# Patient Record
Sex: Female | Born: 1950 | Race: White | Hispanic: No | Marital: Married | Smoking: Former smoker
Health system: Southern US, Community
[De-identification: ages and names within clinical notes are randomized; demographics above are authoritative.]

## PROBLEM LIST (undated history)

## (undated) DIAGNOSIS — M1990 Unspecified osteoarthritis, unspecified site: Secondary | ICD-10-CM

## (undated) HISTORY — DX: Unspecified osteoarthritis, unspecified site: M19.90

## (undated) HISTORY — PX: OTHER SURGICAL HISTORY: SHX169

## (undated) HISTORY — PX: COLONOSCOPY: SHX174

## (undated) HISTORY — PX: BACK SURGERY: SHX140

## (undated) HISTORY — PX: TRIGGER FINGER RELEASE: SHX641

---

## 1997-12-29 ENCOUNTER — Other Ambulatory Visit: Admission: RE | Admit: 1997-12-29 | Discharge: 1997-12-29 | Payer: Self-pay | Admitting: *Deleted

## 1999-01-10 ENCOUNTER — Other Ambulatory Visit: Admission: RE | Admit: 1999-01-10 | Discharge: 1999-01-10 | Payer: Self-pay | Admitting: *Deleted

## 1999-05-31 ENCOUNTER — Encounter: Admission: RE | Admit: 1999-05-31 | Discharge: 1999-05-31 | Payer: Self-pay | Admitting: *Deleted

## 1999-05-31 ENCOUNTER — Encounter: Payer: Self-pay | Admitting: *Deleted

## 1999-06-01 ENCOUNTER — Encounter: Admission: RE | Admit: 1999-06-01 | Discharge: 1999-06-01 | Payer: Self-pay | Admitting: *Deleted

## 1999-06-01 ENCOUNTER — Encounter: Payer: Self-pay | Admitting: *Deleted

## 2000-01-09 ENCOUNTER — Other Ambulatory Visit: Admission: RE | Admit: 2000-01-09 | Discharge: 2000-01-09 | Payer: Self-pay | Admitting: *Deleted

## 2000-06-14 ENCOUNTER — Encounter: Payer: Self-pay | Admitting: *Deleted

## 2000-06-14 ENCOUNTER — Encounter: Admission: RE | Admit: 2000-06-14 | Discharge: 2000-06-14 | Payer: Self-pay | Admitting: *Deleted

## 2000-12-31 ENCOUNTER — Other Ambulatory Visit: Admission: RE | Admit: 2000-12-31 | Discharge: 2000-12-31 | Payer: Self-pay | Admitting: *Deleted

## 2001-06-18 ENCOUNTER — Encounter: Admission: RE | Admit: 2001-06-18 | Discharge: 2001-06-18 | Payer: Self-pay | Admitting: *Deleted

## 2001-06-18 ENCOUNTER — Encounter: Payer: Self-pay | Admitting: *Deleted

## 2002-01-01 ENCOUNTER — Other Ambulatory Visit: Admission: RE | Admit: 2002-01-01 | Discharge: 2002-01-01 | Payer: Self-pay | Admitting: *Deleted

## 2002-06-26 ENCOUNTER — Encounter: Admission: RE | Admit: 2002-06-26 | Discharge: 2002-06-26 | Payer: Self-pay | Admitting: *Deleted

## 2002-06-26 ENCOUNTER — Encounter: Payer: Self-pay | Admitting: *Deleted

## 2003-01-06 ENCOUNTER — Other Ambulatory Visit: Admission: RE | Admit: 2003-01-06 | Discharge: 2003-01-06 | Payer: Self-pay | Admitting: *Deleted

## 2003-07-01 ENCOUNTER — Encounter: Admission: RE | Admit: 2003-07-01 | Discharge: 2003-07-01 | Payer: Self-pay | Admitting: *Deleted

## 2004-01-12 ENCOUNTER — Other Ambulatory Visit: Admission: RE | Admit: 2004-01-12 | Discharge: 2004-01-12 | Payer: Self-pay | Admitting: *Deleted

## 2004-06-30 ENCOUNTER — Encounter: Admission: RE | Admit: 2004-06-30 | Discharge: 2004-06-30 | Payer: Self-pay | Admitting: *Deleted

## 2005-02-13 ENCOUNTER — Other Ambulatory Visit: Admission: RE | Admit: 2005-02-13 | Discharge: 2005-02-13 | Payer: Self-pay | Admitting: *Deleted

## 2005-07-04 ENCOUNTER — Encounter: Admission: RE | Admit: 2005-07-04 | Discharge: 2005-07-04 | Payer: Self-pay | Admitting: *Deleted

## 2006-02-22 ENCOUNTER — Other Ambulatory Visit: Admission: RE | Admit: 2006-02-22 | Discharge: 2006-02-22 | Payer: Self-pay | Admitting: *Deleted

## 2006-07-11 ENCOUNTER — Encounter: Admission: RE | Admit: 2006-07-11 | Discharge: 2006-07-11 | Payer: Self-pay | Admitting: *Deleted

## 2006-07-11 IMAGING — MG MM SCREEN MAMMOGRAM BILATERAL
4 series · 4 of 4 positions shown · non-contrast
Comparison: none

DG SCREEN MAMMOGRAM BILATERAL
Bilateral CC and MLO view(s) were taken.

DIGITAL SCREENING MAMMOGRAM WITH CAD:
There is a fibroglandular pattern.  No masses or malignant type calcifications are identified.  
Compared with prior studies.

[R CC]
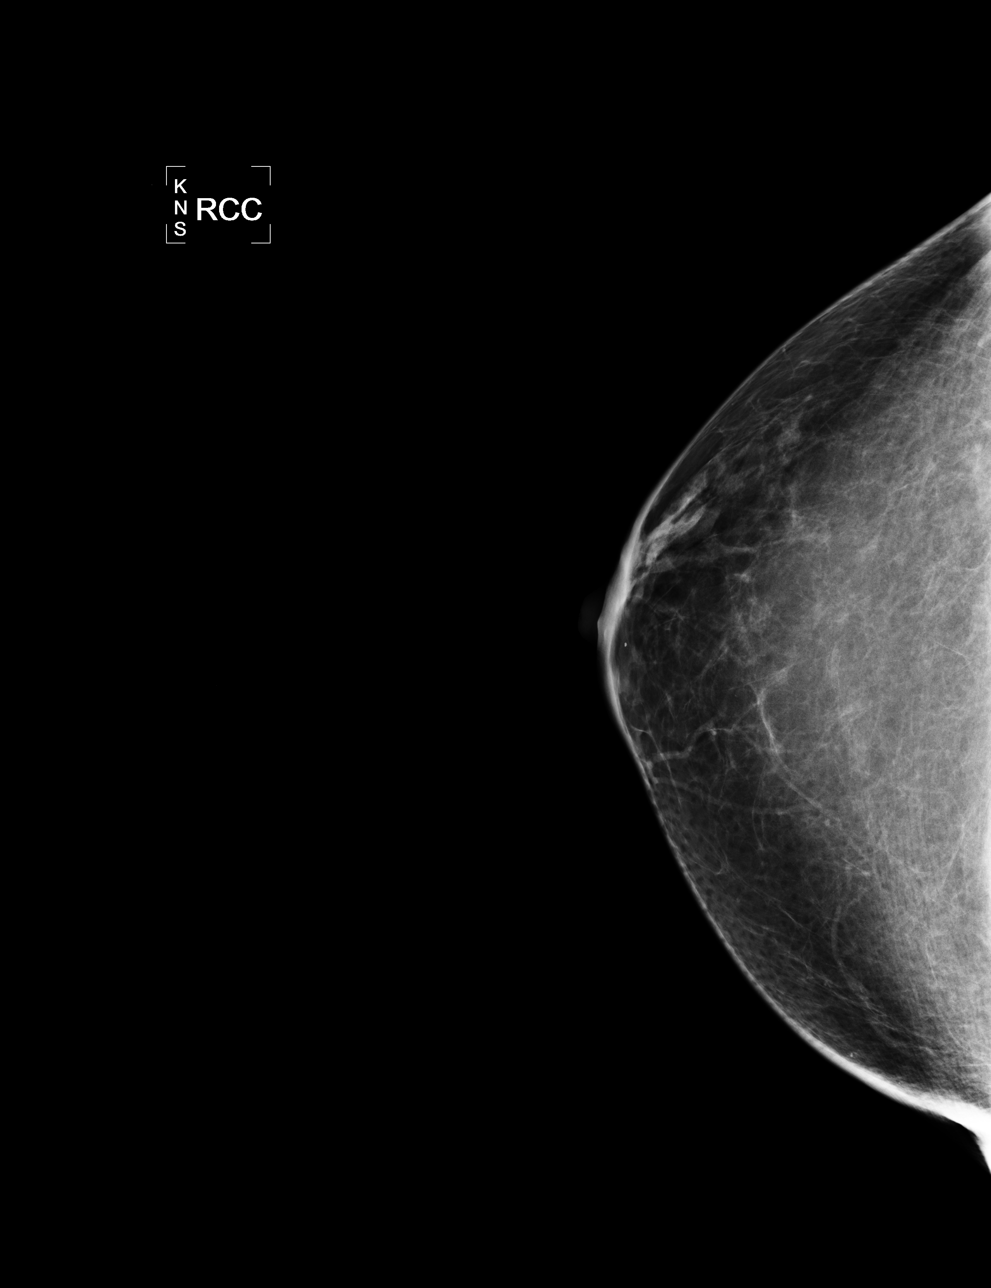

[L CC]
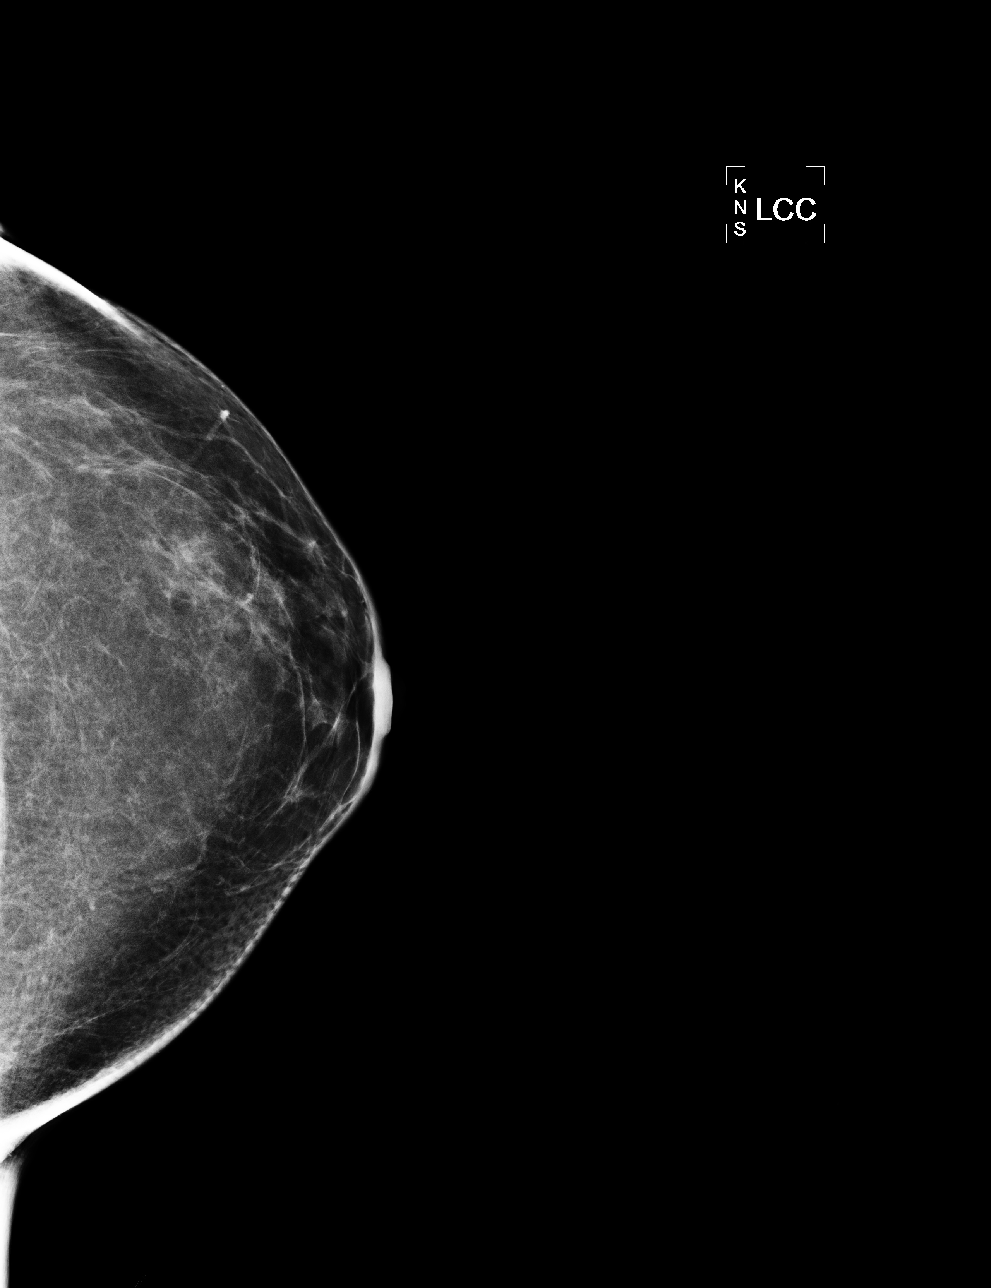

[L MLO]
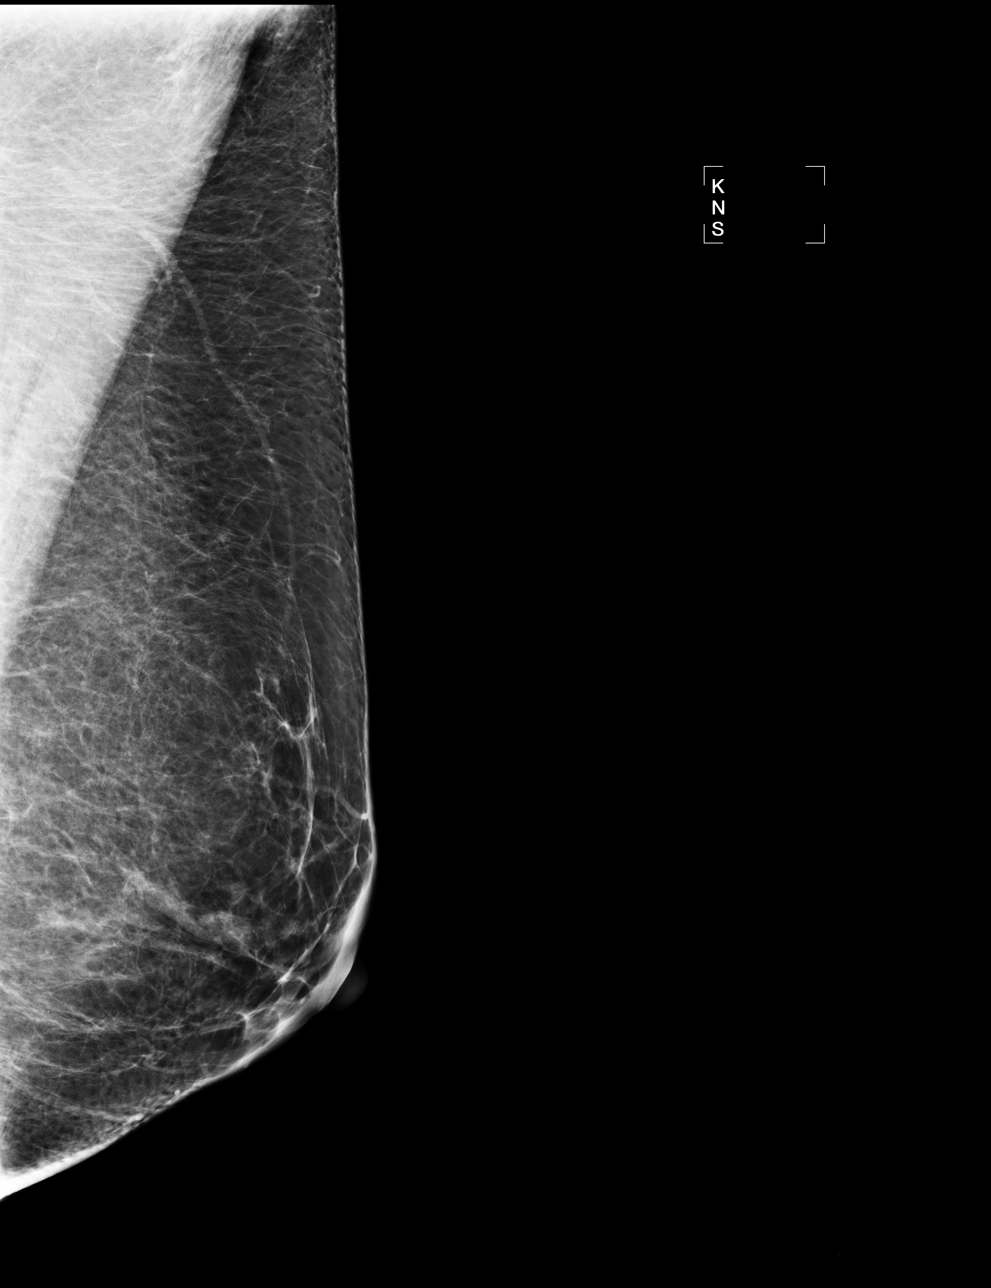

[R MLO]
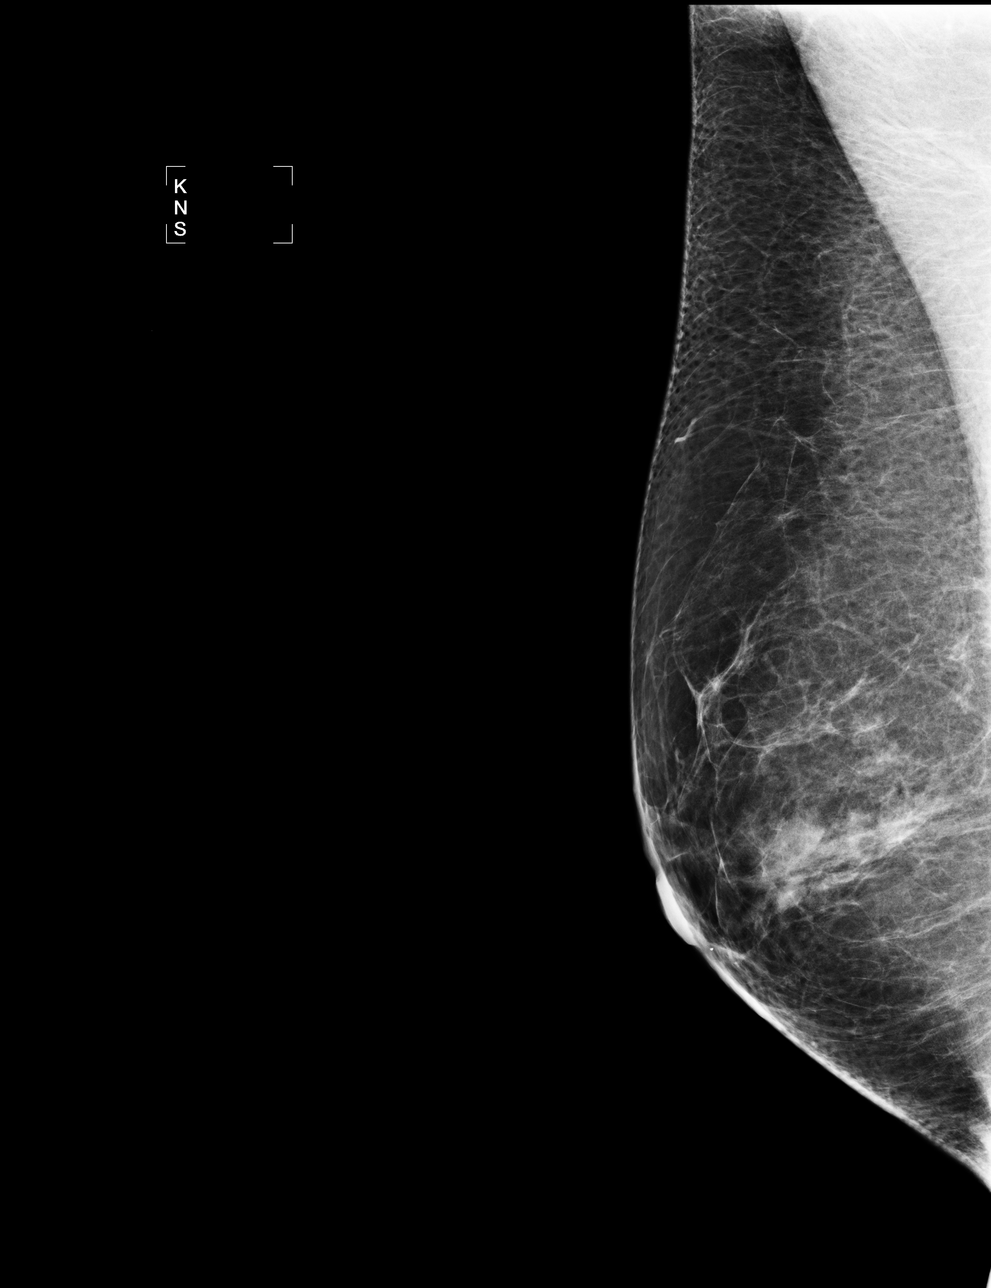

[4 of 4 positions shown; findings below may reference images not displayed]

IMPRESSION: No specific mammographic evidence of malignancy.  Next screening mammogram is recommended in one 
year.

ASSESSMENT: Negative - BI-RADS 1

Screening mammogram in 1 year.
ANALYZED BY COMPUTER AIDED DETECTION. , THIS PROCEDURE WAS A DIGITAL MAMMOGRAM.

## 2007-02-19 ENCOUNTER — Other Ambulatory Visit: Admission: RE | Admit: 2007-02-19 | Discharge: 2007-02-19 | Payer: Self-pay | Admitting: *Deleted

## 2007-07-17 ENCOUNTER — Encounter: Admission: RE | Admit: 2007-07-17 | Discharge: 2007-07-17 | Payer: Self-pay | Admitting: *Deleted

## 2007-07-17 IMAGING — MG MM SCREEN MAMMOGRAM BILATERAL
4 series · 4 of 4 positions shown · non-contrast
Comparison: Prior studies.

DG SCREEN MAMMOGRAM BILATERAL
Bilateral CC and MLO view(s) were taken.

DIGITAL SCREENING MAMMOGRAM WITH CAD:

[R CC]
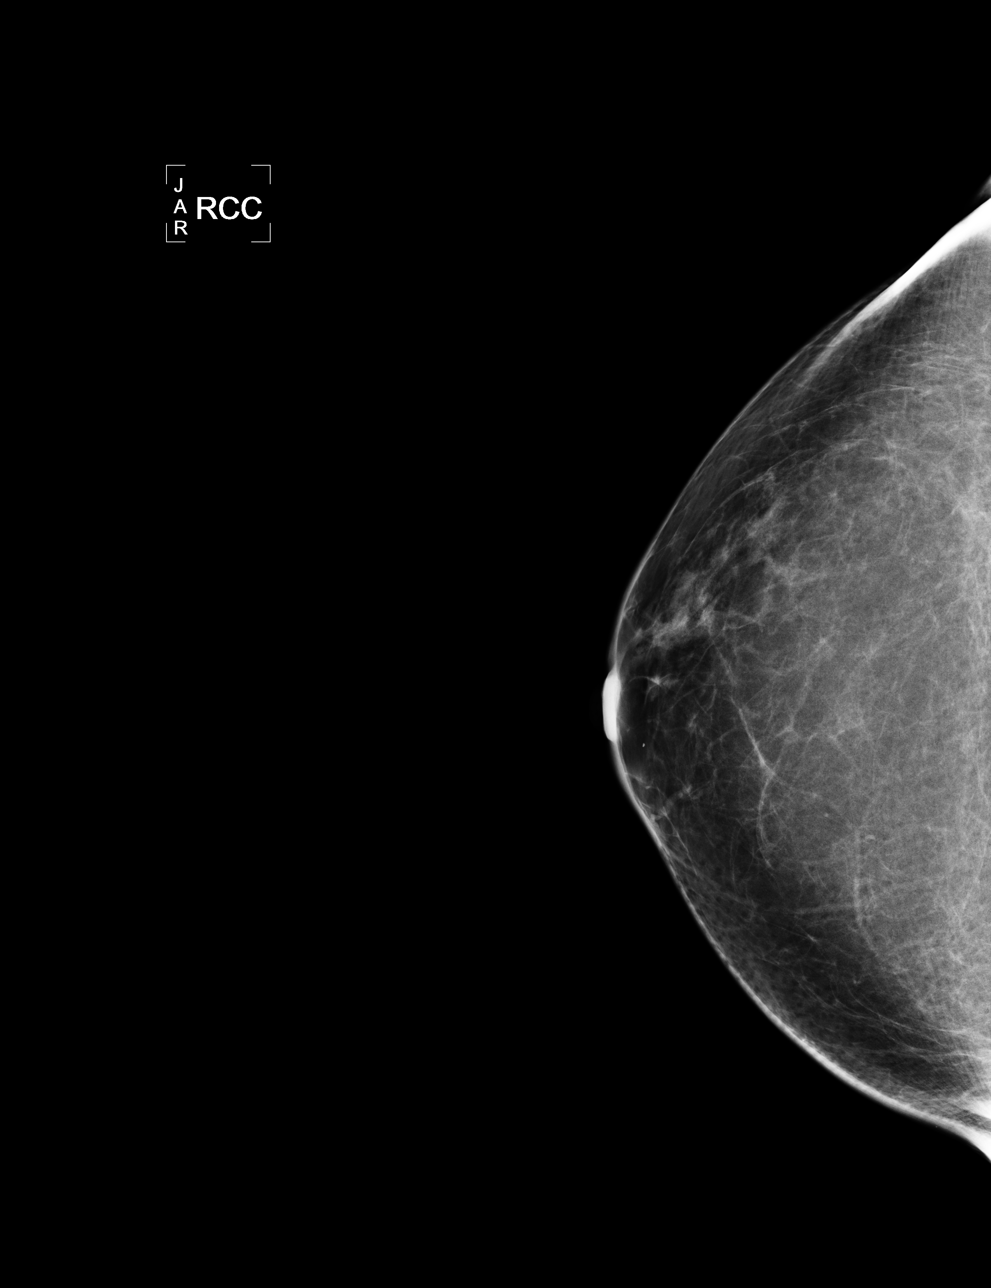

[L CC]
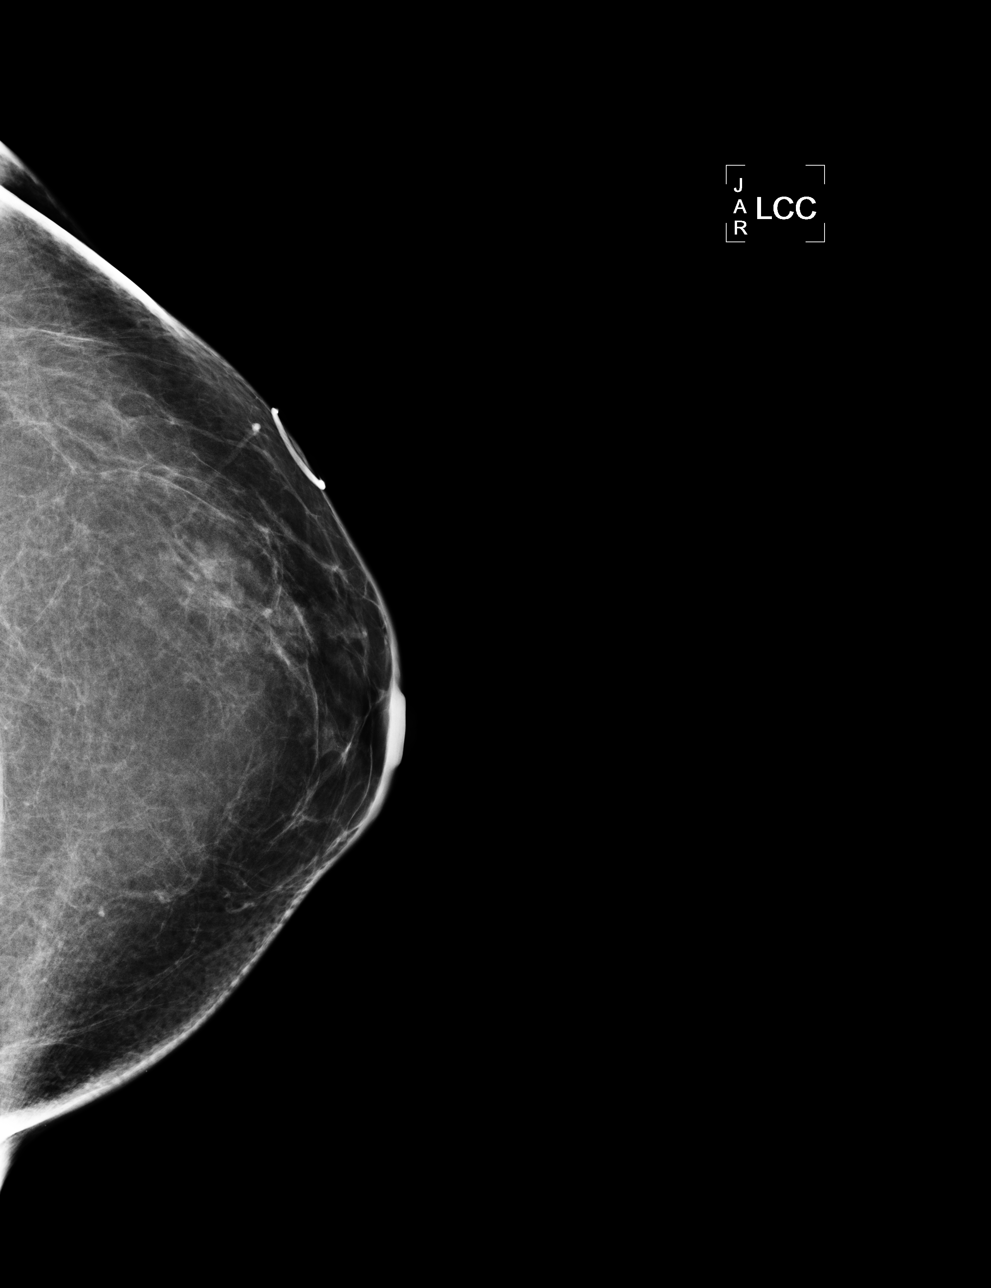

[L MLO]
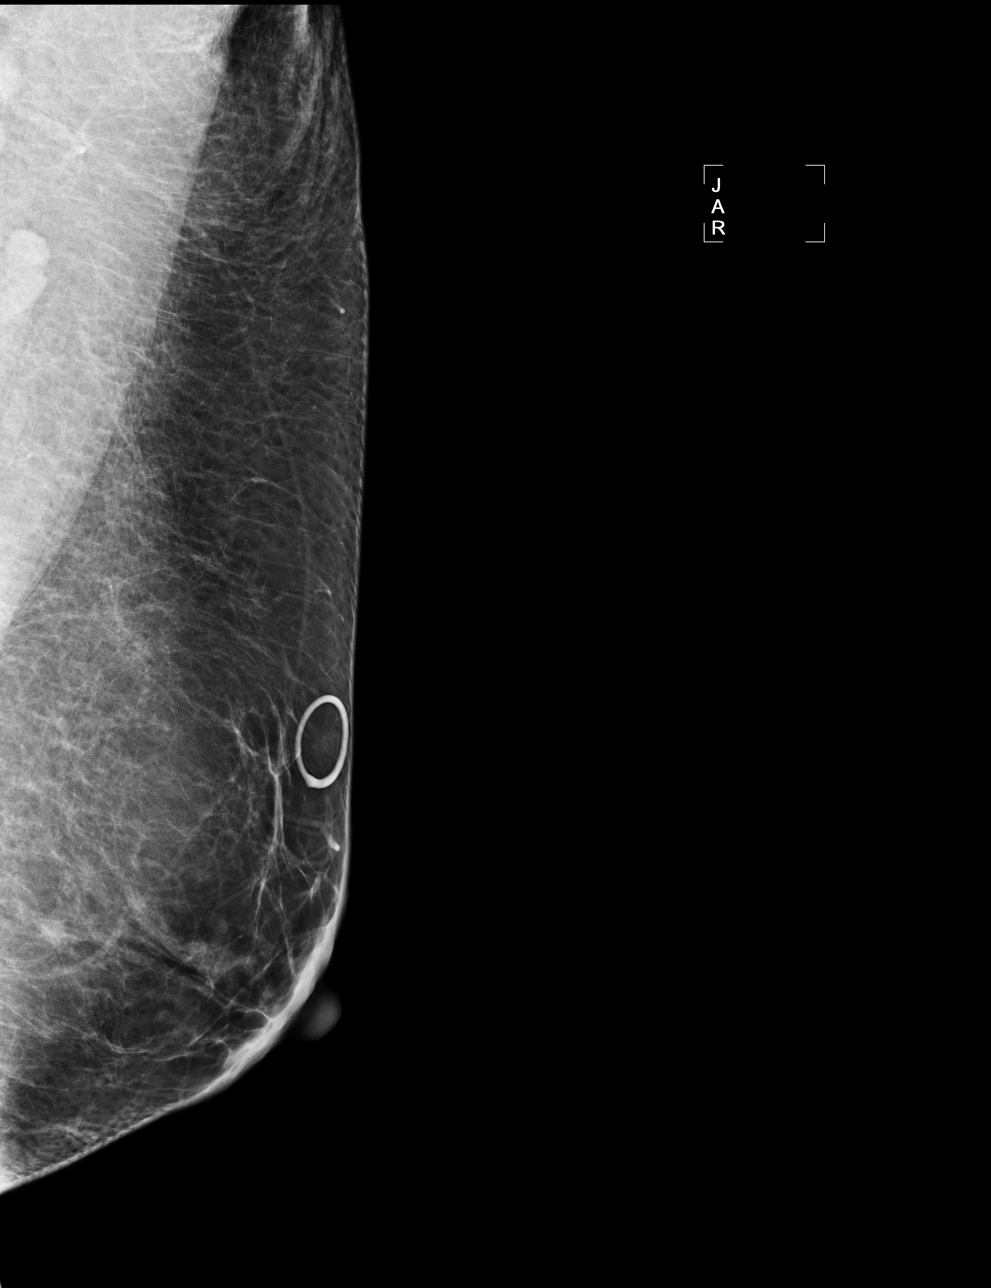

[R MLO]
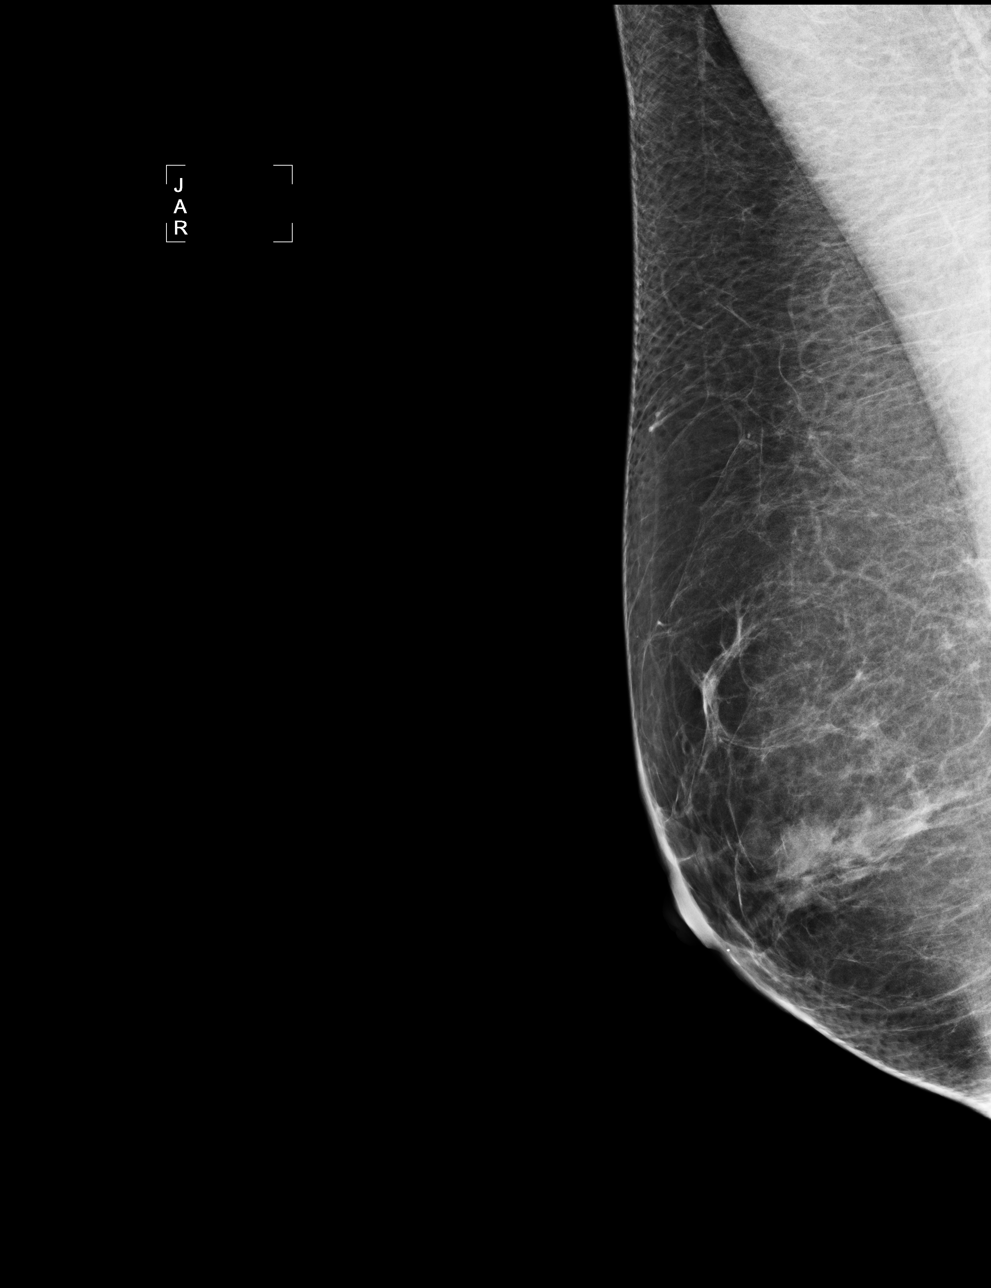

[4 of 4 positions shown; findings below may reference images not displayed]

There are scattered fibroglandular densities.  There is no dominant mass, architectural distortion 
or calcification to suggest malignancy.
IMPRESSION: No mammographic evidence of malignancy.  Suggest yearly screening mammography.

ASSESSMENT: Negative - BI-RADS 1

Screening mammogram in 1 year.
ANALYZED BY COMPUTER AIDED DETECTION. , THIS PROCEDURE WAS A DIGITAL MAMMOGRAM.

## 2007-08-06 ENCOUNTER — Ambulatory Visit (HOSPITAL_BASED_OUTPATIENT_CLINIC_OR_DEPARTMENT_OTHER): Admission: RE | Admit: 2007-08-06 | Discharge: 2007-08-06 | Payer: Self-pay | Admitting: Orthopedic Surgery

## 2008-07-09 ENCOUNTER — Encounter (INDEPENDENT_AMBULATORY_CARE_PROVIDER_SITE_OTHER): Payer: Self-pay | Admitting: *Deleted

## 2008-07-17 ENCOUNTER — Encounter: Admission: RE | Admit: 2008-07-17 | Discharge: 2008-07-17 | Payer: Self-pay | Admitting: Geriatric Medicine

## 2008-07-17 IMAGING — MG MM SCREEN MAMMOGRAM BILATERAL
4 series · 4 of 4 positions shown · non-contrast
Comparison: none

DG SCREEN MAMMOGRAM BILATERAL
Bilateral CC and MLO view(s) were taken.

DIGITAL SCREENING MAMMOGRAM WITH CAD:
There are scattered fibroglandular densities.  No masses or malignant type calcifications are 
identified.  Compared with prior studies.

[R CC]
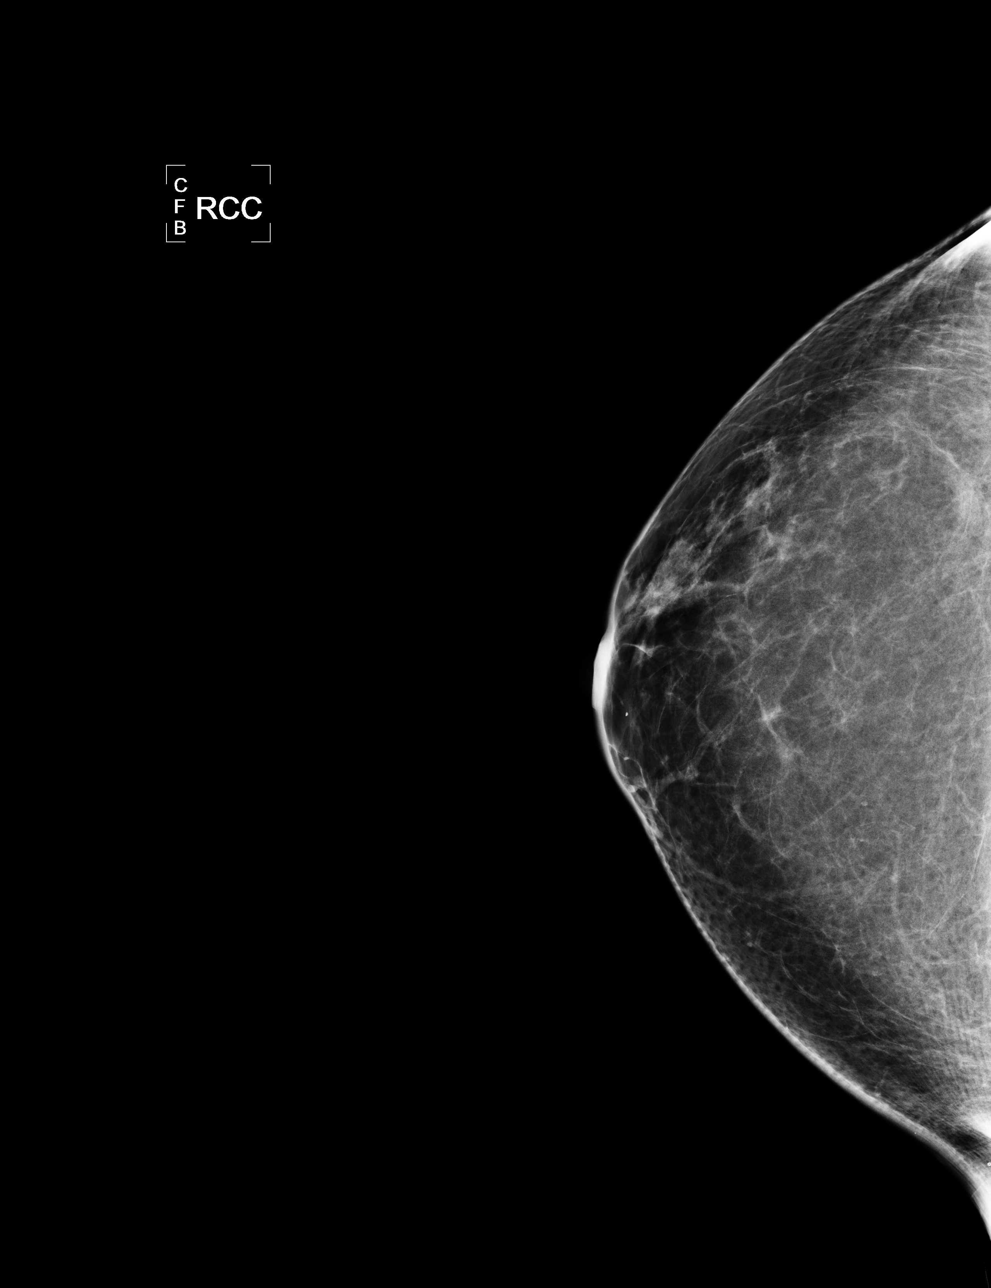

[L CC]
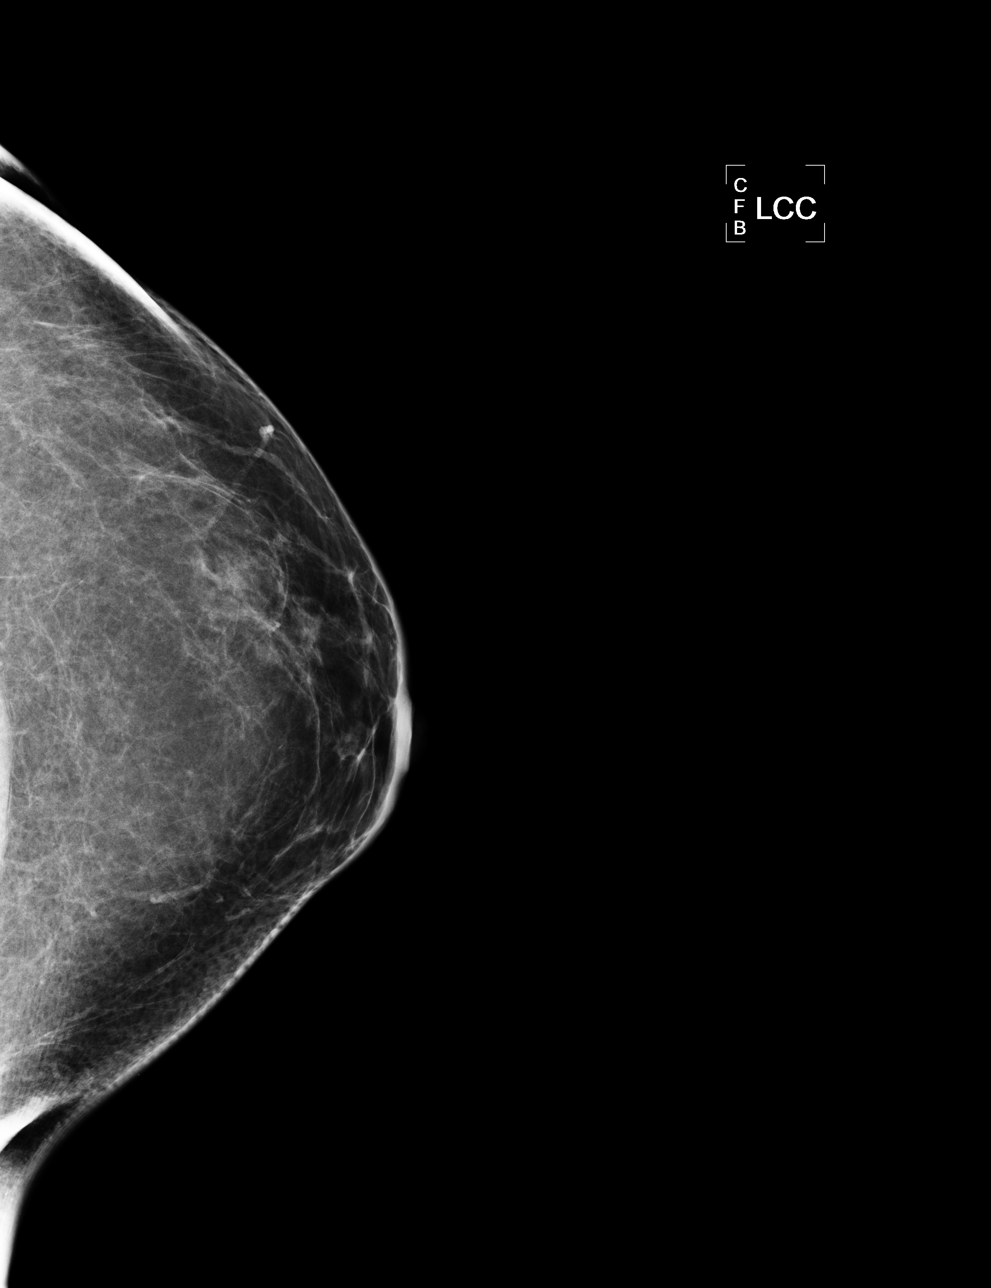

[L MLO]
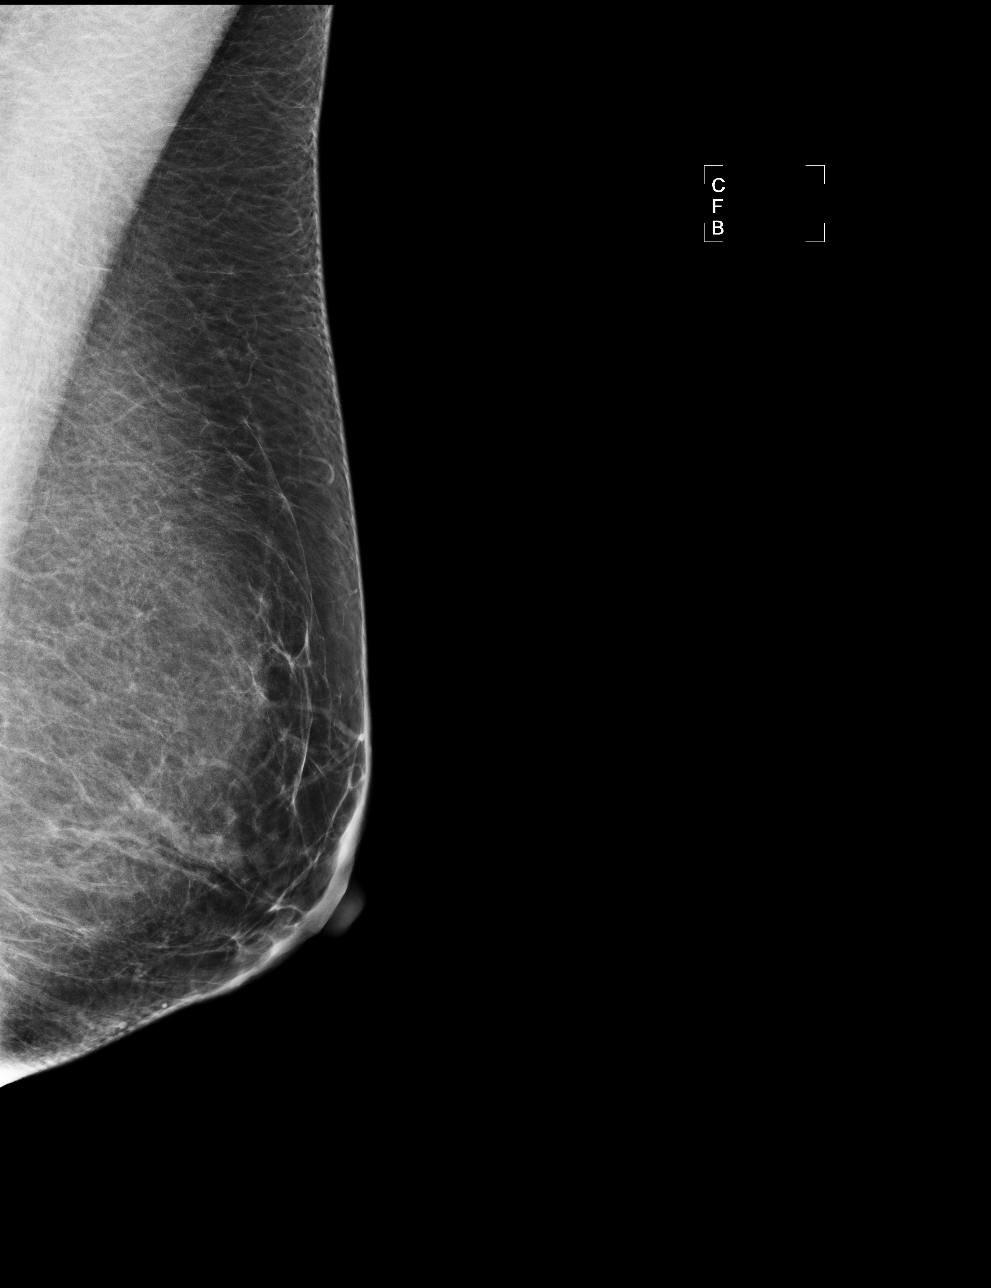

[R MLO]
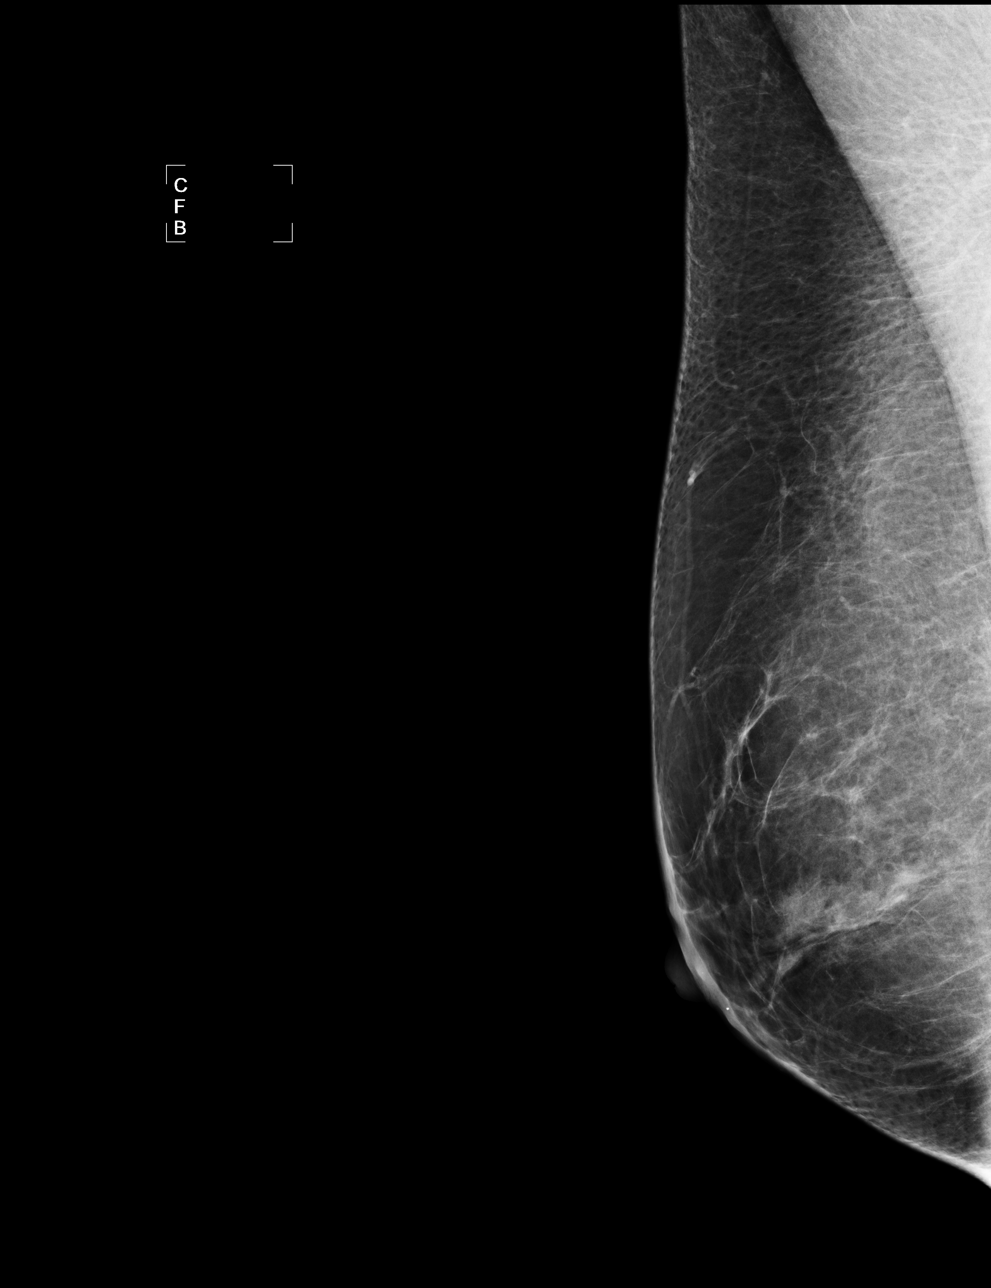

[4 of 4 positions shown; findings below may reference images not displayed]

IMPRESSION: No specific mammographic evidence of malignancy.  Next screening mammogram is recommended in one 
year.

A result letter of this screening mammogram will be mailed directly to the patient.

ASSESSMENT: Negative - BI-RADS 1

Screening mammogram in 1 year.
ANALYZED BY COMPUTER AIDED DETECTION. , THIS PROCEDURE WAS A DIGITAL MAMMOGRAM.

## 2008-11-24 ENCOUNTER — Other Ambulatory Visit: Admission: RE | Admit: 2008-11-24 | Discharge: 2008-11-24 | Payer: Self-pay | Admitting: Obstetrics and Gynecology

## 2009-01-06 ENCOUNTER — Ambulatory Visit: Payer: Self-pay | Admitting: Internal Medicine

## 2009-01-14 ENCOUNTER — Ambulatory Visit: Payer: Self-pay | Admitting: Internal Medicine

## 2009-07-21 ENCOUNTER — Encounter: Admission: RE | Admit: 2009-07-21 | Discharge: 2009-07-21 | Payer: Self-pay | Admitting: Geriatric Medicine

## 2009-07-21 IMAGING — MG MM DIGITAL SCREENING
4 series · 4 of 4 positions shown · non-contrast
Comparison: none

DG SCREEN MAMMOGRAM BILATERAL
Bilateral CC and MLO view(s) were taken.

DIGITAL SCREENING MAMMOGRAM WITH CAD:
There are scattered fibroglandular densities.  No masses or malignant type calcifications are 
identified.  Compared with prior studies.
Images were processed with CAD.

[R CC]
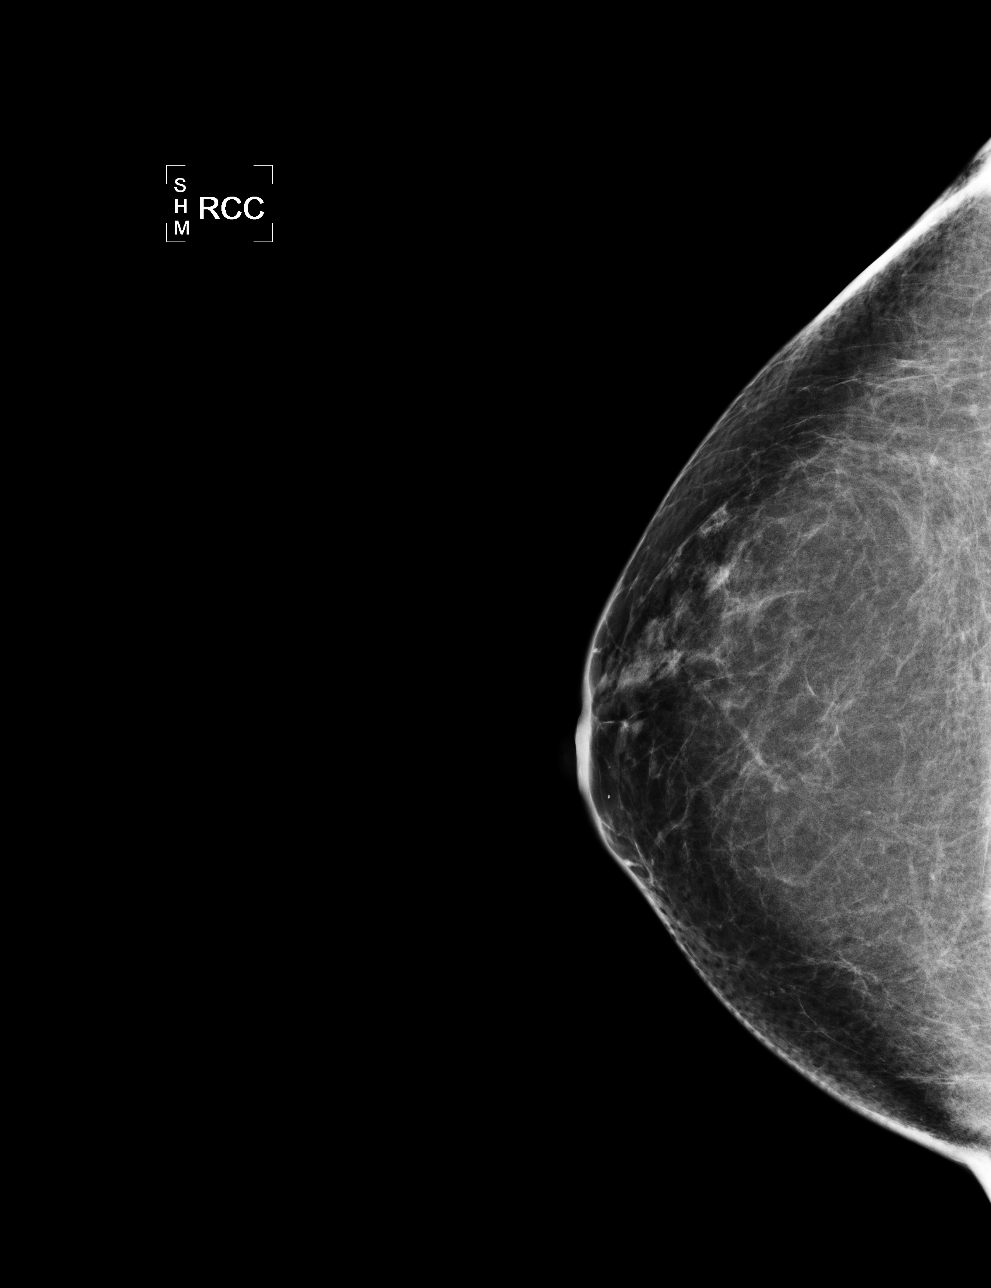

[L CC]
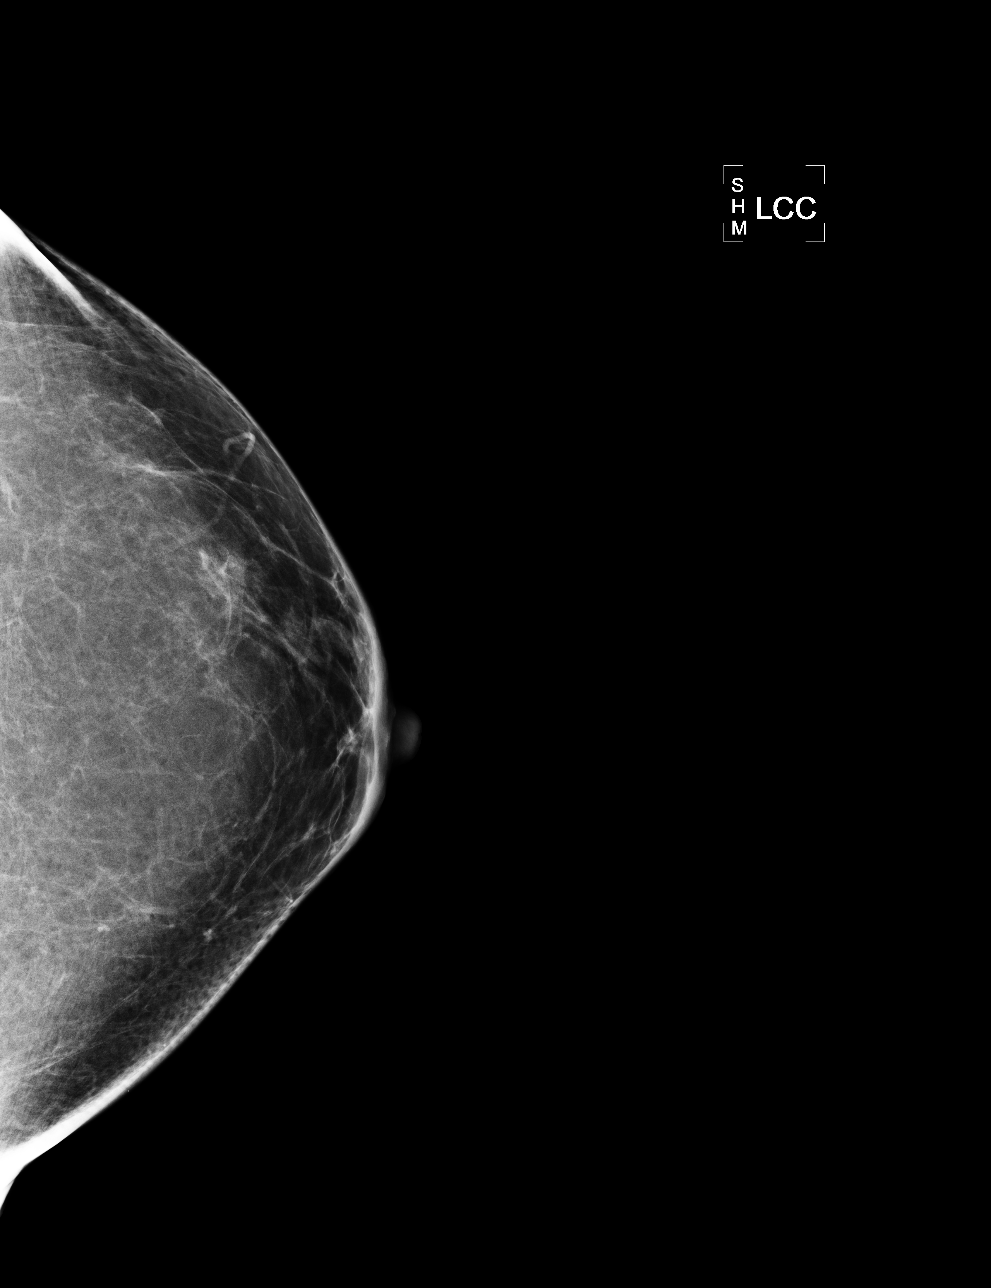

[L MLO]
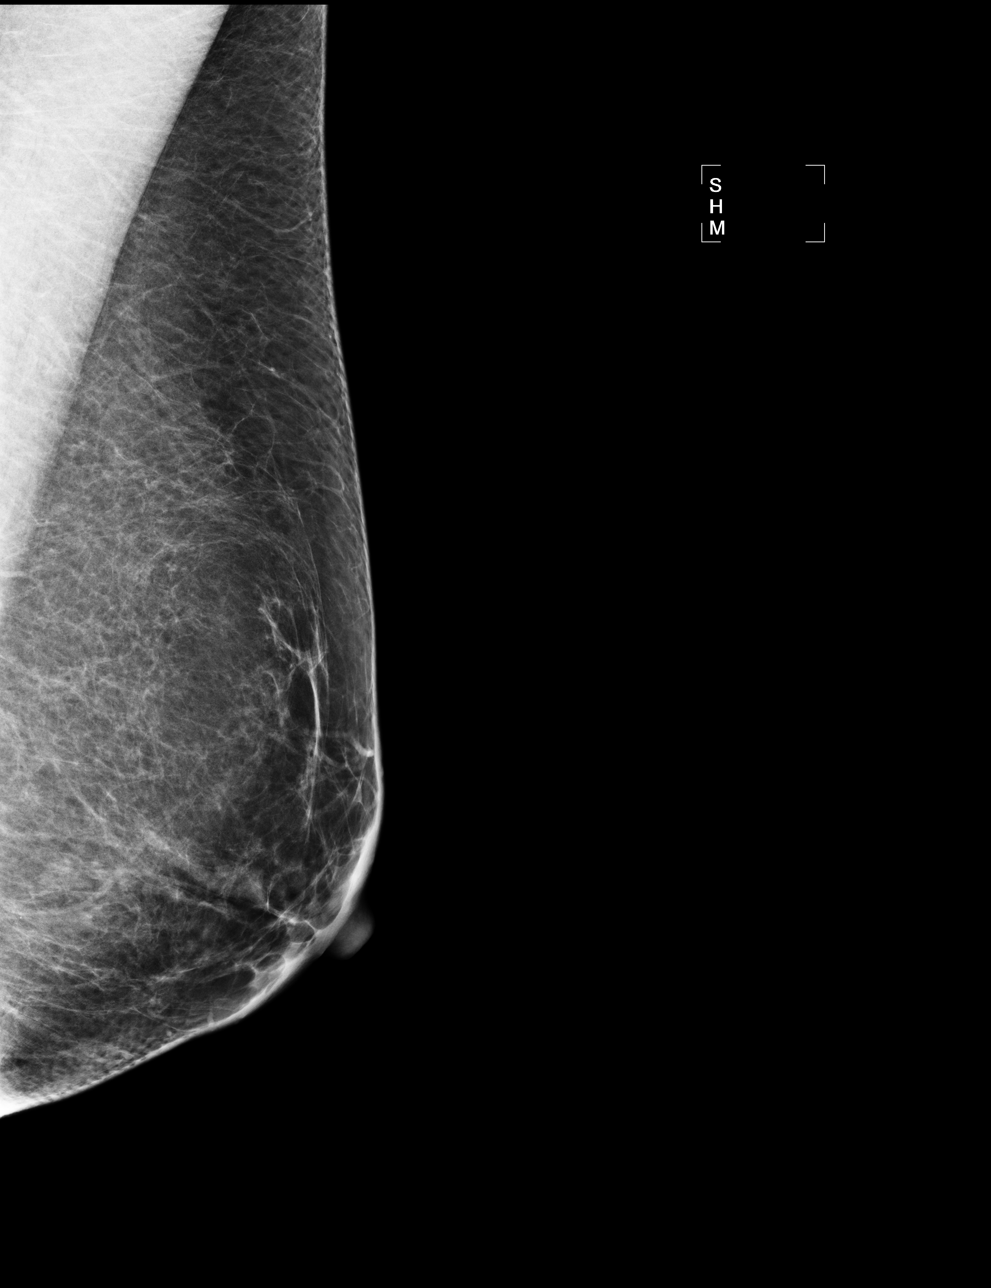

[R MLO]
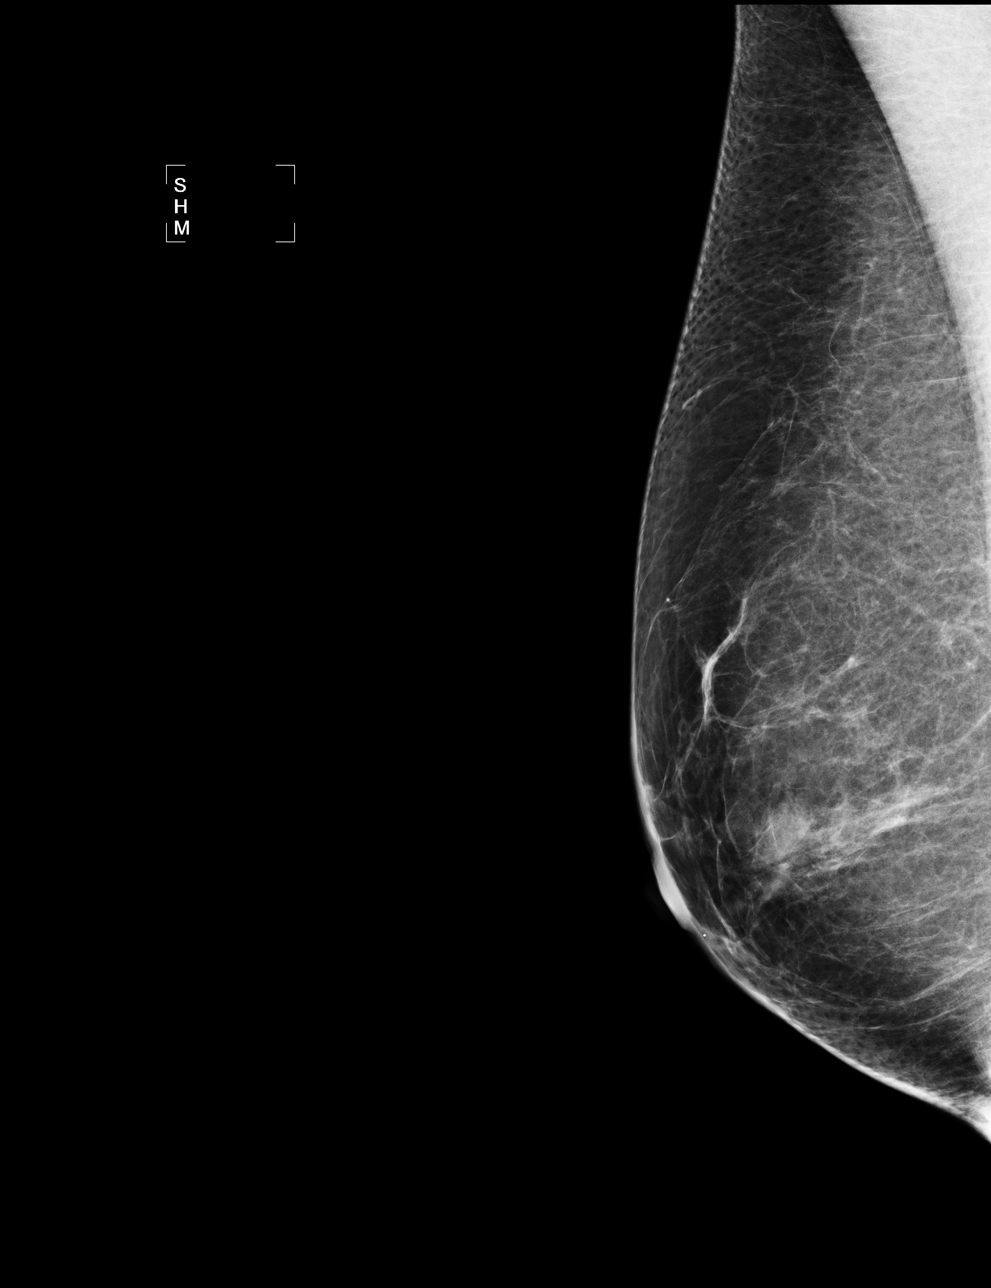

[4 of 4 positions shown; findings below may reference images not displayed]

IMPRESSION: No specific mammographic evidence of malignancy.  Next screening mammogram is recommended in one 
year.

A result letter of this screening mammogram will be mailed directly to the patient.

ASSESSMENT: Negative - BI-RADS 1

Screening mammogram in 1 year.
,

## 2010-07-01 ENCOUNTER — Other Ambulatory Visit: Payer: Self-pay | Admitting: Geriatric Medicine

## 2010-07-01 DIAGNOSIS — Z1231 Encounter for screening mammogram for malignant neoplasm of breast: Secondary | ICD-10-CM

## 2010-07-26 NOTE — Op Note (Signed)
NAMEBettyjane, [name]              ACCOUNT NO.:  [id number]   MEDICAL RECORD NO.:  [id number]          PATIENT TYPE:  AMB   LOCATION:  DSC                          FACILITY:  [facility]   PHYSICIAN:  Katy Fitch. Sypher, M.D. 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   OPERATION:  Release of left thumb A1 pulley.   OPERATIONS:  Katy Fitch. Sypher, MD   ASSISTANT:  Marveen Reeks Dasnoit, PA-C   ANESTHESIA:  Lidocaine 2%, metacarpal head level block and flexor sheath  block, left thumb supplemented by IV sedation.   SUPERVISING ANESTHESIOLOGIST:  Quita Skye. Krista Blue, MD   INDICATIONS:  Nichole Simpson is a 56-year right-hand dominant woman who  has had a long history of stenosing tenosynovitis affecting her left  thumb.  She has failed nonoperative measures including rest, anti-  inflammatory medication, and steroid injection.  Due to failure to  respond to nonoperative measures, she is brought to the operating at  this time for release of her left thumb A1 pulley.   PROCEDURE IN DETAIL:  Nichole Simpson was brought to the operating room  and placed in supine position upon the operating table.   Following IV access on the right, sedation was provided.  The left arm  was then prepped with Betadine soap and solution and sterilely draped.  A pneumatic tourniquet was applied to the proximal brachium.  Upon  exsanguination of the left arm with Esmarch bandage, the arterial  tourniquet was inflated to 220 mmHg.  Lidocaine 2% was infiltrated into  the path of the intended incision and around digital nerves at the base  of the metacarpal neck.  After a few moments, excellent anesthesia was  achieved.   A short transverse incision was fashioned  directly over the palpably  thickened A1 pulley.  Subcutaneous tissues were carefully divided taking  care to identify the flexor sheath and radial proper digital nerve.  This was gently retracted while the sheath was split with scalpel and  scissors.  Thereafter, Nichole Simpson demonstrated active range of motion of  her IP joint.  She had hyperextension of 45 degrees, further flexion of  80 degrees.   The wound was then repaired with mattress suture of 5-0 nylon.   Compressive dressing was applied with Xeroflo sterile gauze and Ace  wrap.   For aftercare, she is provided prescription for Darvocet-N 100 one p.o.  q.4-6 h. p.r.n. pain, 20 tablets without refill.   We will see her back in followup in the office in 1 week for dressing  changes and suture removal.      Katy Fitch. Sypher, M.D.  Electronically Signed     RVS/MEDQ  D:  08/06/2007  T:  08/06/2007  Job:  [id number]   cc:   Leatha Gilding. Mezer, M.D.  Almedia Balls. Randell Patient, M.D.

## 2010-07-27 ENCOUNTER — Ambulatory Visit
Admission: RE | Admit: 2010-07-27 | Discharge: 2010-07-27 | Disposition: A | Discharge status: Home / Self Care | Payer: BC Managed Care – PPO | Source: Ambulatory Visit | Attending: Geriatric Medicine | Admitting: Geriatric Medicine

## 2010-07-27 DIAGNOSIS — Z1231 Encounter for screening mammogram for malignant neoplasm of breast: Secondary | ICD-10-CM

## 2010-12-07 LAB — POCT HEMOGLOBIN-HEMACUE: Hemoglobin: 14.8

## 2010-12-20 ENCOUNTER — Other Ambulatory Visit (HOSPITAL_COMMUNITY)
Admission: RE | Admit: 2010-12-20 | Discharge: 2010-12-20 | Disposition: A | Discharge status: Home / Self Care | Payer: BC Managed Care – PPO | Source: Ambulatory Visit | Attending: Geriatric Medicine | Admitting: Geriatric Medicine

## 2010-12-20 ENCOUNTER — Other Ambulatory Visit: Payer: Self-pay | Admitting: *Deleted

## 2010-12-20 DIAGNOSIS — Z01419 Encounter for gynecological examination (general) (routine) without abnormal findings: Secondary | ICD-10-CM | POA: Insufficient documentation

## 2011-07-20 ENCOUNTER — Other Ambulatory Visit: Payer: Self-pay | Admitting: Geriatric Medicine

## 2011-07-20 DIAGNOSIS — Z1231 Encounter for screening mammogram for malignant neoplasm of breast: Secondary | ICD-10-CM

## 2011-08-10 ENCOUNTER — Ambulatory Visit
Admission: RE | Admit: 2011-08-10 | Discharge: 2011-08-10 | Disposition: A | Discharge status: Home / Self Care | Payer: BC Managed Care – PPO | Source: Ambulatory Visit | Attending: Geriatric Medicine | Admitting: Geriatric Medicine

## 2011-08-10 DIAGNOSIS — Z1231 Encounter for screening mammogram for malignant neoplasm of breast: Secondary | ICD-10-CM

## 2011-10-26 ENCOUNTER — Other Ambulatory Visit: Payer: Self-pay | Admitting: Dermatology

## 2012-07-01 ENCOUNTER — Other Ambulatory Visit: Payer: Self-pay

## 2012-07-01 DIAGNOSIS — Z1231 Encounter for screening mammogram for malignant neoplasm of breast: Secondary | ICD-10-CM

## 2012-08-14 ENCOUNTER — Ambulatory Visit
Admission: RE | Admit: 2012-08-14 | Discharge: 2012-08-14 | Disposition: A | Discharge status: Home / Self Care | Payer: BC Managed Care – PPO | Source: Ambulatory Visit

## 2012-08-14 DIAGNOSIS — Z1231 Encounter for screening mammogram for malignant neoplasm of breast: Secondary | ICD-10-CM

## 2013-01-21 ENCOUNTER — Other Ambulatory Visit (HOSPITAL_COMMUNITY)
Admission: RE | Admit: 2013-01-21 | Discharge: 2013-01-21 | Disposition: A | Discharge status: Home / Self Care | Payer: BC Managed Care – PPO | Source: Ambulatory Visit | Attending: Geriatric Medicine | Admitting: Geriatric Medicine

## 2013-01-21 ENCOUNTER — Other Ambulatory Visit: Payer: Self-pay | Admitting: Geriatric Medicine

## 2013-01-21 DIAGNOSIS — Z01419 Encounter for gynecological examination (general) (routine) without abnormal findings: Secondary | ICD-10-CM | POA: Insufficient documentation

## 2013-02-10 ENCOUNTER — Other Ambulatory Visit: Payer: Self-pay | Admitting: Dermatology

## 2013-07-15 ENCOUNTER — Other Ambulatory Visit: Payer: Self-pay

## 2013-07-15 DIAGNOSIS — Z1231 Encounter for screening mammogram for malignant neoplasm of breast: Secondary | ICD-10-CM

## 2013-08-15 ENCOUNTER — Encounter (INDEPENDENT_AMBULATORY_CARE_PROVIDER_SITE_OTHER): Payer: Self-pay

## 2013-08-15 ENCOUNTER — Ambulatory Visit
Admission: RE | Admit: 2013-08-15 | Discharge: 2013-08-15 | Disposition: A | Discharge status: Home / Self Care | Payer: BC Managed Care – PPO | Source: Ambulatory Visit

## 2013-08-15 DIAGNOSIS — Z1231 Encounter for screening mammogram for malignant neoplasm of breast: Secondary | ICD-10-CM

## 2013-10-23 ENCOUNTER — Encounter: Payer: Self-pay | Admitting: Internal Medicine

## 2014-07-13 ENCOUNTER — Other Ambulatory Visit: Payer: Self-pay

## 2014-07-13 DIAGNOSIS — Z1231 Encounter for screening mammogram for malignant neoplasm of breast: Secondary | ICD-10-CM

## 2014-08-17 ENCOUNTER — Ambulatory Visit
Admission: RE | Admit: 2014-08-17 | Discharge: 2014-08-17 | Disposition: A | Discharge status: Home / Self Care | Payer: BLUE CROSS/BLUE SHIELD | Source: Ambulatory Visit

## 2014-08-17 DIAGNOSIS — Z1231 Encounter for screening mammogram for malignant neoplasm of breast: Secondary | ICD-10-CM

## 2015-07-30 ENCOUNTER — Other Ambulatory Visit: Payer: Self-pay

## 2015-07-30 DIAGNOSIS — Z1231 Encounter for screening mammogram for malignant neoplasm of breast: Secondary | ICD-10-CM

## 2015-08-05 ENCOUNTER — Other Ambulatory Visit: Payer: Self-pay | Admitting: Geriatric Medicine

## 2015-08-05 ENCOUNTER — Other Ambulatory Visit (HOSPITAL_COMMUNITY)
Admission: RE | Admit: 2015-08-05 | Discharge: 2015-08-05 | Disposition: A | Discharge status: Home / Self Care | Payer: BLUE CROSS/BLUE SHIELD | Source: Ambulatory Visit | Attending: Geriatric Medicine | Admitting: Geriatric Medicine

## 2015-08-05 DIAGNOSIS — Z01419 Encounter for gynecological examination (general) (routine) without abnormal findings: Secondary | ICD-10-CM | POA: Insufficient documentation

## 2015-08-05 DIAGNOSIS — Z1151 Encounter for screening for human papillomavirus (HPV): Secondary | ICD-10-CM | POA: Diagnosis not present

## 2015-08-11 LAB — CYTOLOGY - PAP

## 2015-08-23 ENCOUNTER — Other Ambulatory Visit: Payer: Self-pay | Admitting: Geriatric Medicine

## 2015-08-23 ENCOUNTER — Ambulatory Visit
Admission: RE | Admit: 2015-08-23 | Discharge: 2015-08-23 | Disposition: A | Discharge status: Home / Self Care | Payer: BLUE CROSS/BLUE SHIELD | Source: Ambulatory Visit

## 2015-08-23 DIAGNOSIS — Z1231 Encounter for screening mammogram for malignant neoplasm of breast: Secondary | ICD-10-CM

## 2015-09-23 DIAGNOSIS — H16041 Marginal corneal ulcer, right eye: Secondary | ICD-10-CM | POA: Diagnosis not present

## 2015-09-24 DIAGNOSIS — H16041 Marginal corneal ulcer, right eye: Secondary | ICD-10-CM | POA: Diagnosis not present

## 2015-09-27 DIAGNOSIS — H16041 Marginal corneal ulcer, right eye: Secondary | ICD-10-CM | POA: Diagnosis not present

## 2015-11-30 DIAGNOSIS — H52223 Regular astigmatism, bilateral: Secondary | ICD-10-CM | POA: Diagnosis not present

## 2015-11-30 DIAGNOSIS — H5213 Myopia, bilateral: Secondary | ICD-10-CM | POA: Diagnosis not present

## 2015-11-30 DIAGNOSIS — H524 Presbyopia: Secondary | ICD-10-CM | POA: Diagnosis not present

## 2015-11-30 DIAGNOSIS — H25813 Combined forms of age-related cataract, bilateral: Secondary | ICD-10-CM | POA: Diagnosis not present

## 2016-01-21 DIAGNOSIS — L919 Hypertrophic disorder of the skin, unspecified: Secondary | ICD-10-CM | POA: Diagnosis not present

## 2016-01-21 DIAGNOSIS — L570 Actinic keratosis: Secondary | ICD-10-CM | POA: Diagnosis not present

## 2016-01-21 DIAGNOSIS — L99 Other disorders of skin and subcutaneous tissue in diseases classified elsewhere: Secondary | ICD-10-CM | POA: Diagnosis not present

## 2016-01-21 DIAGNOSIS — D485 Neoplasm of uncertain behavior of skin: Secondary | ICD-10-CM | POA: Diagnosis not present

## 2016-07-20 DIAGNOSIS — H00021 Hordeolum internum right upper eyelid: Secondary | ICD-10-CM | POA: Diagnosis not present

## 2016-09-27 DIAGNOSIS — L720 Epidermal cyst: Secondary | ICD-10-CM | POA: Diagnosis not present

## 2016-09-28 ENCOUNTER — Other Ambulatory Visit: Payer: Self-pay | Admitting: Geriatric Medicine

## 2016-09-28 DIAGNOSIS — Z1231 Encounter for screening mammogram for malignant neoplasm of breast: Secondary | ICD-10-CM

## 2016-11-14 ENCOUNTER — Ambulatory Visit: Payer: BLUE CROSS/BLUE SHIELD

## 2016-12-14 ENCOUNTER — Ambulatory Visit: Payer: Self-pay

## 2016-12-26 ENCOUNTER — Ambulatory Visit
Admission: RE | Admit: 2016-12-26 | Discharge: 2016-12-26 | Disposition: A | Discharge status: Home / Self Care | Payer: Self-pay | Source: Ambulatory Visit | Attending: Geriatric Medicine | Admitting: Geriatric Medicine

## 2016-12-26 DIAGNOSIS — Z0000 Encounter for general adult medical examination without abnormal findings: Secondary | ICD-10-CM | POA: Diagnosis not present

## 2016-12-26 DIAGNOSIS — Z23 Encounter for immunization: Secondary | ICD-10-CM | POA: Diagnosis not present

## 2016-12-26 DIAGNOSIS — E7800 Pure hypercholesterolemia, unspecified: Secondary | ICD-10-CM | POA: Diagnosis not present

## 2016-12-26 DIAGNOSIS — Z1231 Encounter for screening mammogram for malignant neoplasm of breast: Secondary | ICD-10-CM

## 2016-12-26 DIAGNOSIS — Z780 Asymptomatic menopausal state: Secondary | ICD-10-CM | POA: Diagnosis not present

## 2016-12-26 DIAGNOSIS — Z79899 Other long term (current) drug therapy: Secondary | ICD-10-CM | POA: Diagnosis not present

## 2016-12-26 IMAGING — MG 2D DIGITAL SCREENING BILATERAL MAMMOGRAM WITH CAD AND ADJUNCT TO
9 of 12 series · 9 of 28 positions shown · non-contrast
Comparison: Previous exam(s).

CLINICAL DATA: Screening.

EXAM:
2D DIGITAL SCREENING BILATERAL MAMMOGRAM WITH CAD AND ADJUNCT TOMO

[L CC]
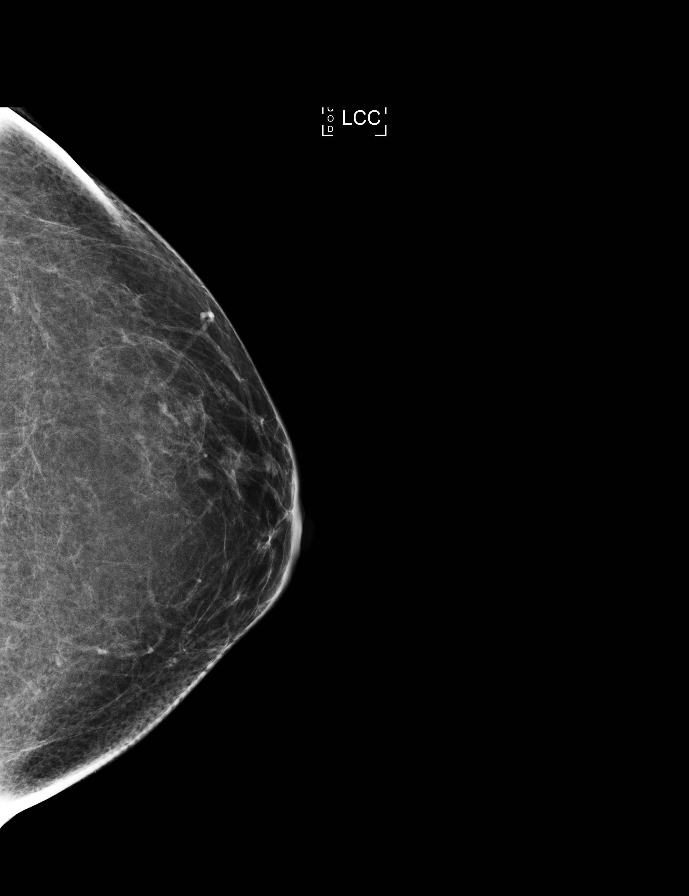

[L MLO synth-2D]
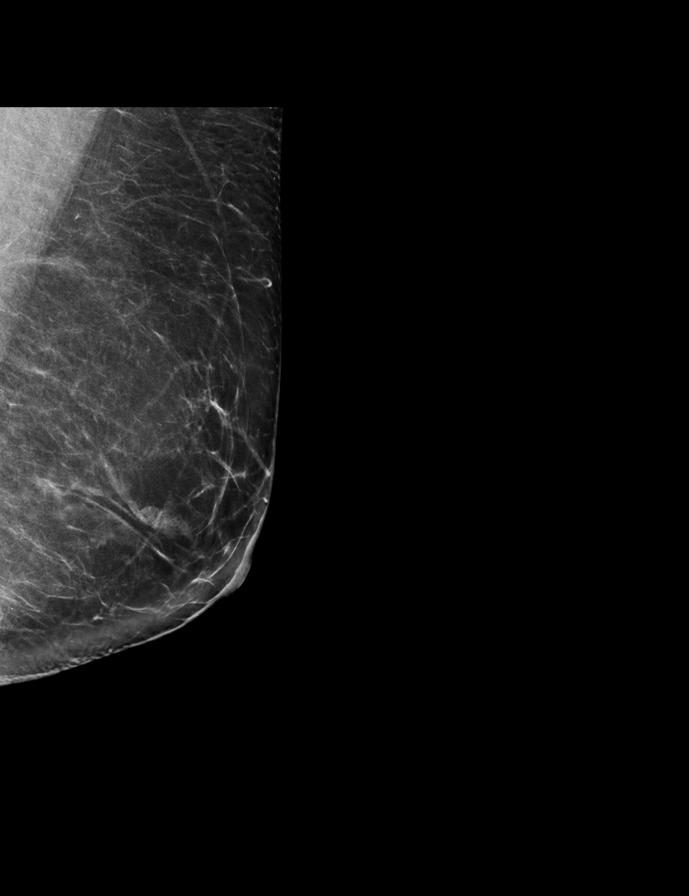

[R MLO]
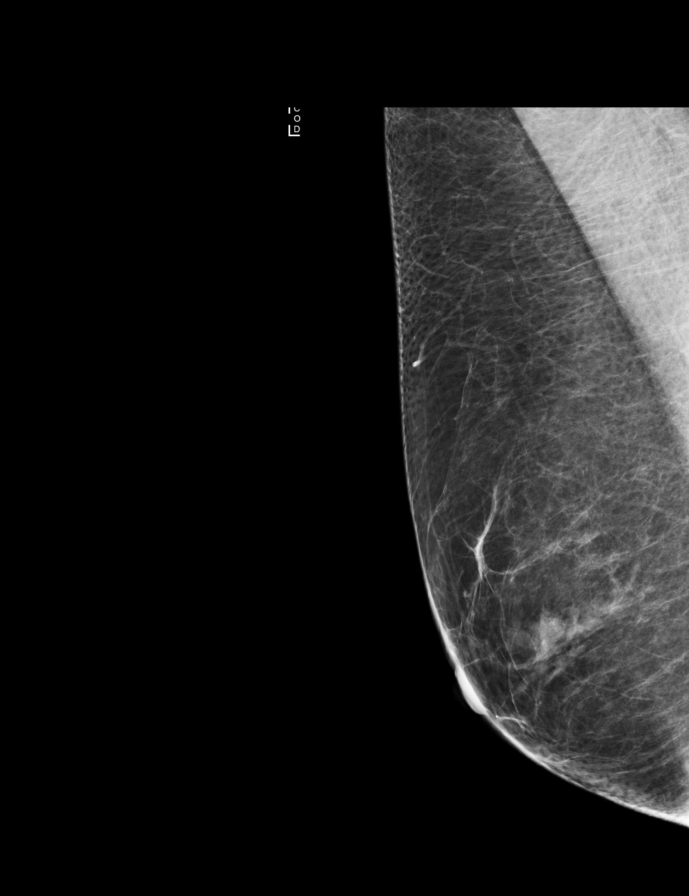

[L MLO]
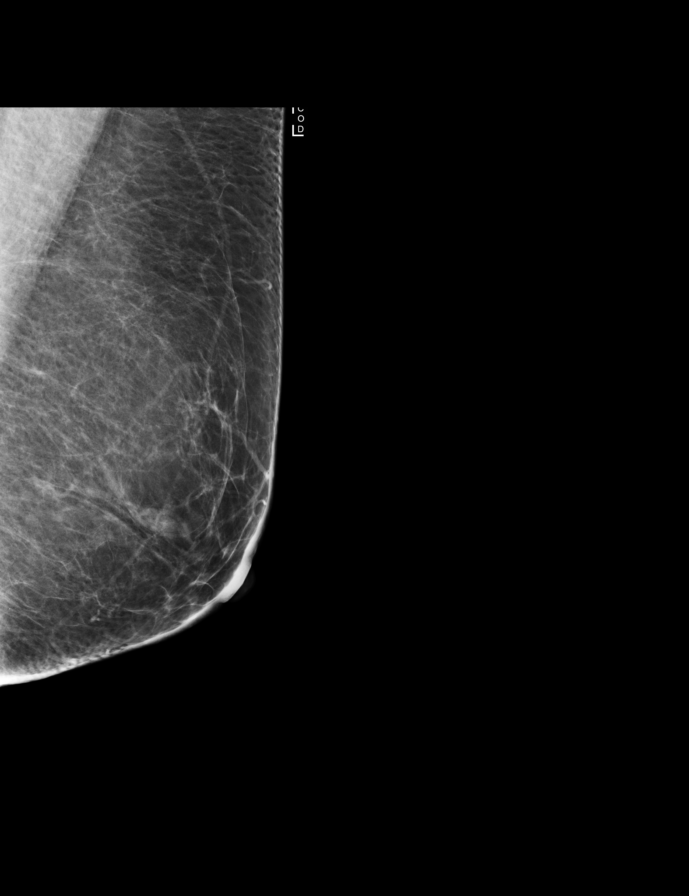

[R CC]
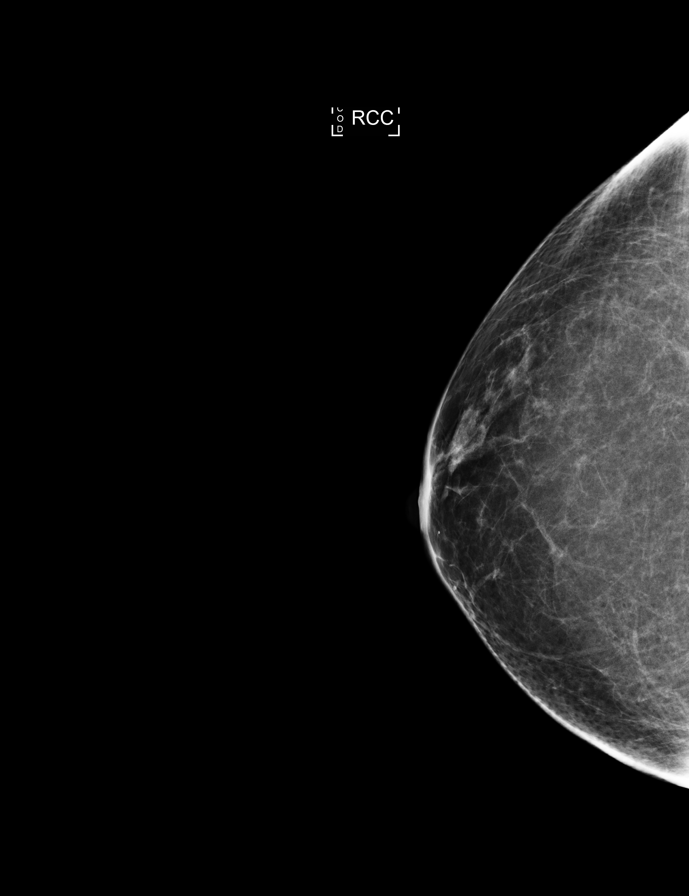

[R CC synth-2D]
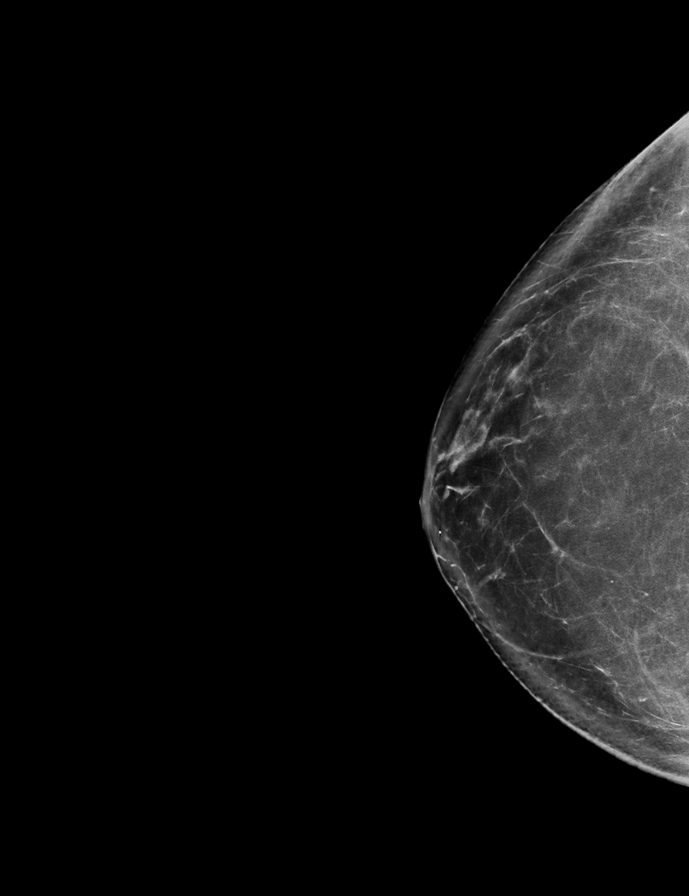

[L CC synth-2D]
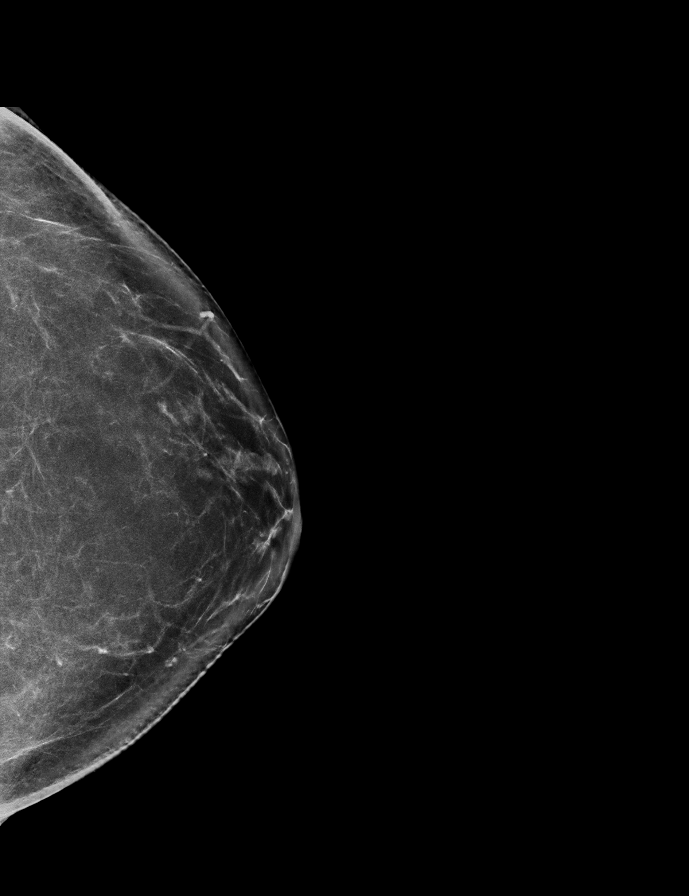

[R MLO synth-2D]
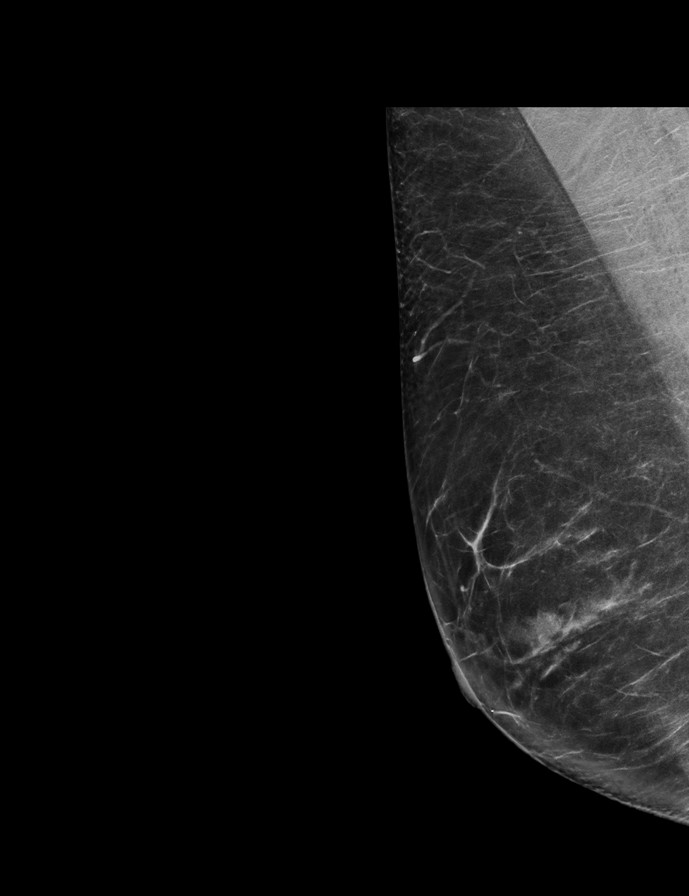

[L CC tomo · tomo slice 39/76.0]
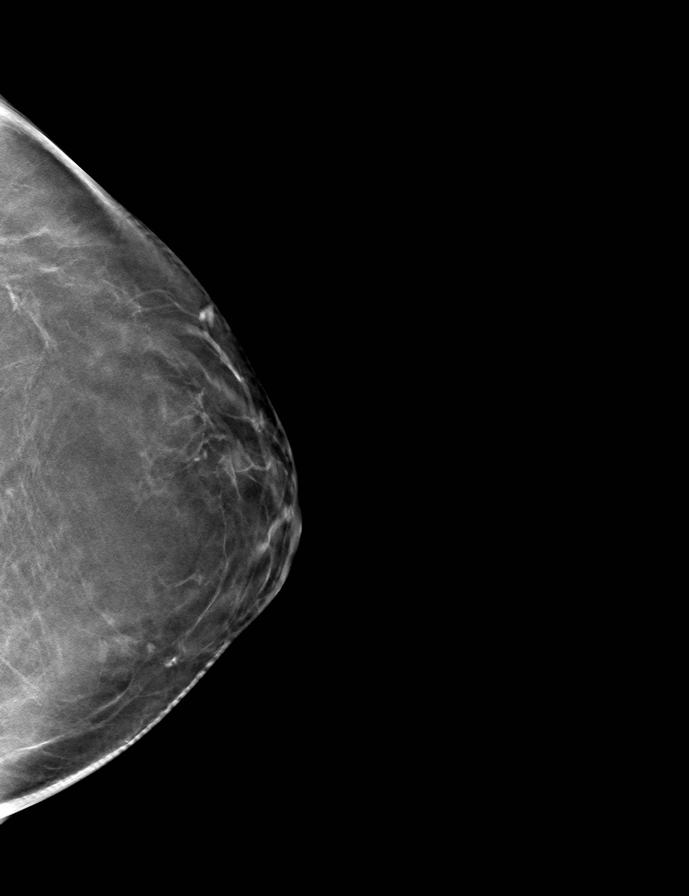

[9 of 28 positions shown; findings below may reference images not displayed]

ACR Breast Density Category b: There are scattered areas of
fibroglandular density.
FINDINGS: There are no findings suspicious for malignancy. Images were
processed with CAD.
IMPRESSION: No mammographic evidence of malignancy. A result letter of this
screening mammogram will be mailed directly to the patient.

RECOMMENDATION:
Screening mammogram in one year. (Code:[33])

BI-RADS CATEGORY  1: Negative.

## 2017-01-31 DIAGNOSIS — H2513 Age-related nuclear cataract, bilateral: Secondary | ICD-10-CM | POA: Diagnosis not present

## 2017-01-31 DIAGNOSIS — H52223 Regular astigmatism, bilateral: Secondary | ICD-10-CM | POA: Diagnosis not present

## 2017-01-31 DIAGNOSIS — H5213 Myopia, bilateral: Secondary | ICD-10-CM | POA: Diagnosis not present

## 2017-01-31 DIAGNOSIS — H524 Presbyopia: Secondary | ICD-10-CM | POA: Diagnosis not present

## 2017-04-26 DIAGNOSIS — Z780 Asymptomatic menopausal state: Secondary | ICD-10-CM | POA: Diagnosis not present

## 2017-04-26 DIAGNOSIS — M8588 Other specified disorders of bone density and structure, other site: Secondary | ICD-10-CM | POA: Diagnosis not present

## 2017-05-14 DIAGNOSIS — L570 Actinic keratosis: Secondary | ICD-10-CM | POA: Diagnosis not present

## 2017-05-14 DIAGNOSIS — L99 Other disorders of skin and subcutaneous tissue in diseases classified elsewhere: Secondary | ICD-10-CM | POA: Diagnosis not present

## 2017-05-14 DIAGNOSIS — L249 Irritant contact dermatitis, unspecified cause: Secondary | ICD-10-CM | POA: Diagnosis not present

## 2017-11-19 ENCOUNTER — Other Ambulatory Visit: Payer: Self-pay | Admitting: Geriatric Medicine

## 2017-11-19 DIAGNOSIS — Z1231 Encounter for screening mammogram for malignant neoplasm of breast: Secondary | ICD-10-CM

## 2017-12-27 ENCOUNTER — Ambulatory Visit: Payer: Medicare Other

## 2018-01-02 ENCOUNTER — Ambulatory Visit
Admission: RE | Admit: 2018-01-02 | Discharge: 2018-01-02 | Disposition: A | Discharge status: Home / Self Care | Payer: Medicare Other | Source: Ambulatory Visit | Attending: Geriatric Medicine | Admitting: Geriatric Medicine

## 2018-01-02 DIAGNOSIS — Z1231 Encounter for screening mammogram for malignant neoplasm of breast: Secondary | ICD-10-CM

## 2018-01-02 IMAGING — MG DIGITAL SCREENING BILATERAL MAMMOGRAM WITH TOMO AND CAD
8 series · 9 of 24 positions shown · non-contrast
Comparison: Previous exam(s).

CLINICAL DATA: Screening.

EXAM:
DIGITAL SCREENING BILATERAL MAMMOGRAM WITH TOMO AND CAD

[R MLO synth-2D]
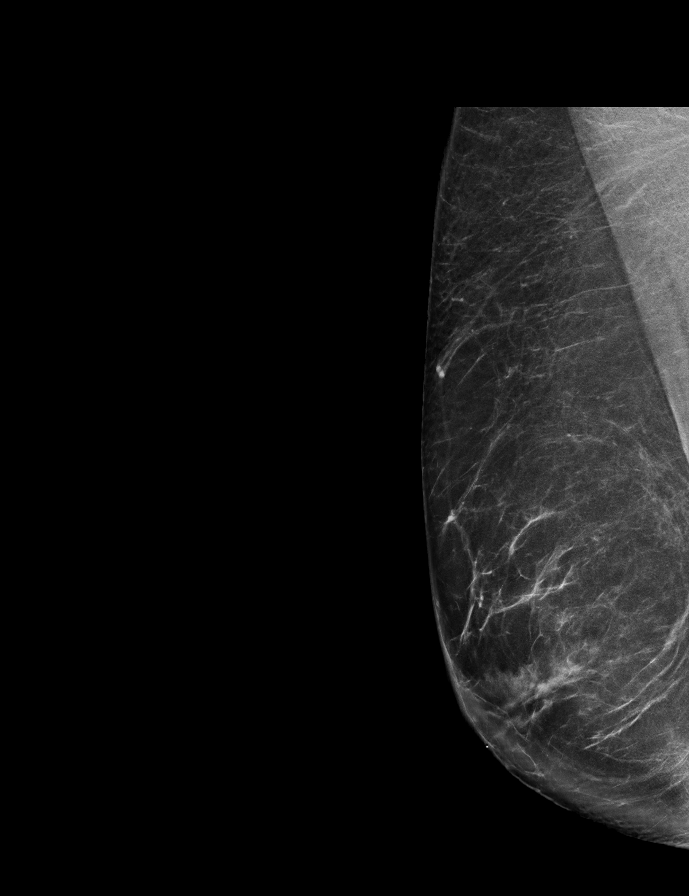

[R CC synth-2D]
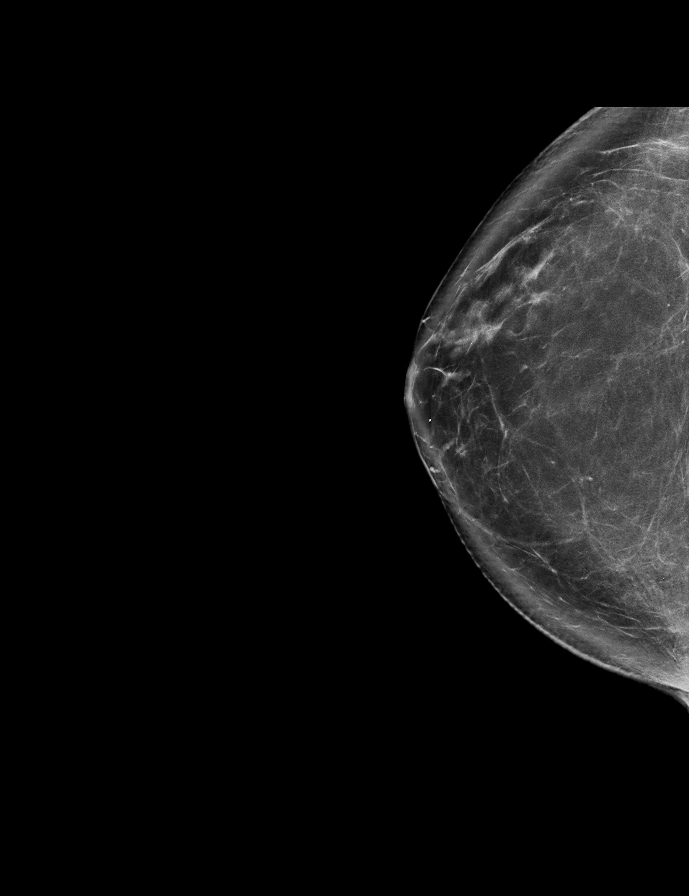

[L MLO synth-2D]
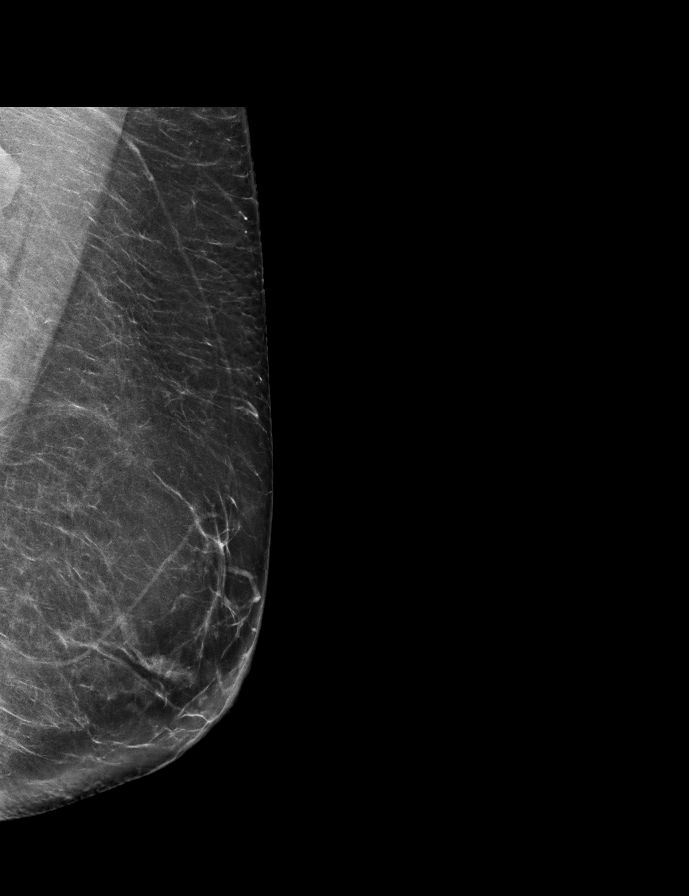

[L CC synth-2D]
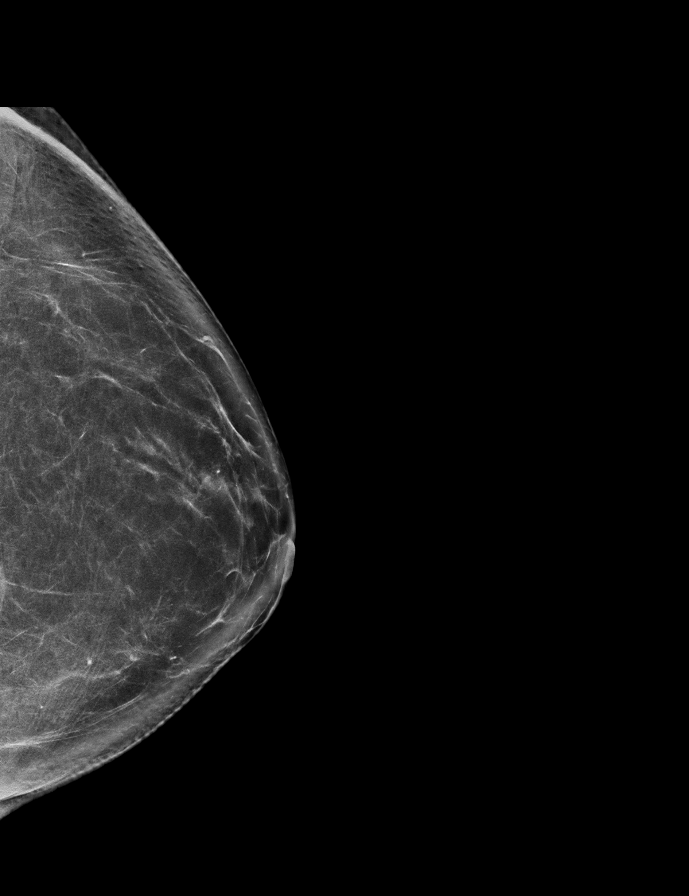

[L CC tomo · 2 of 79 frames shown]
[frame 26/79]
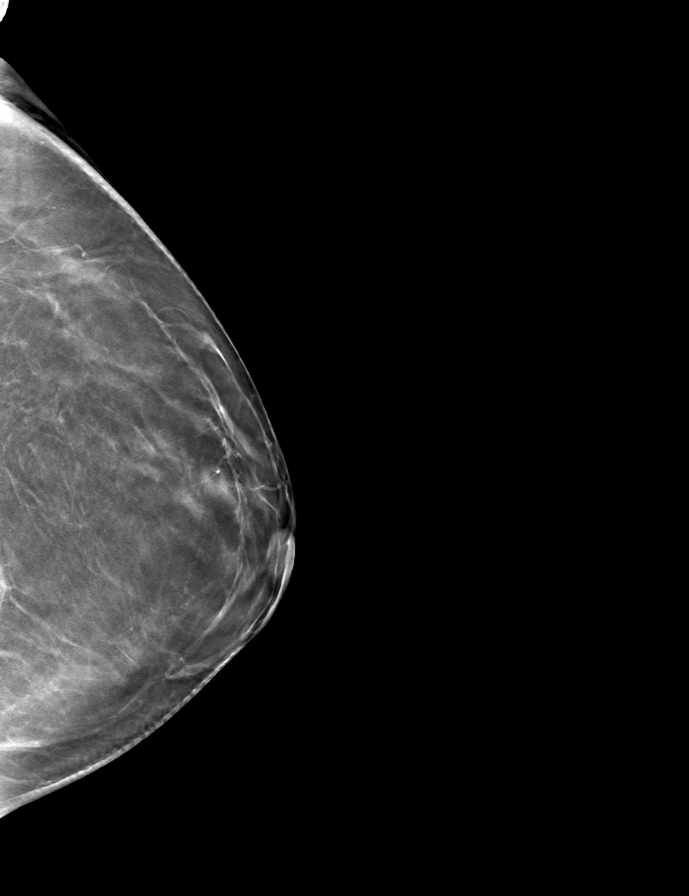
[frame 40/79]
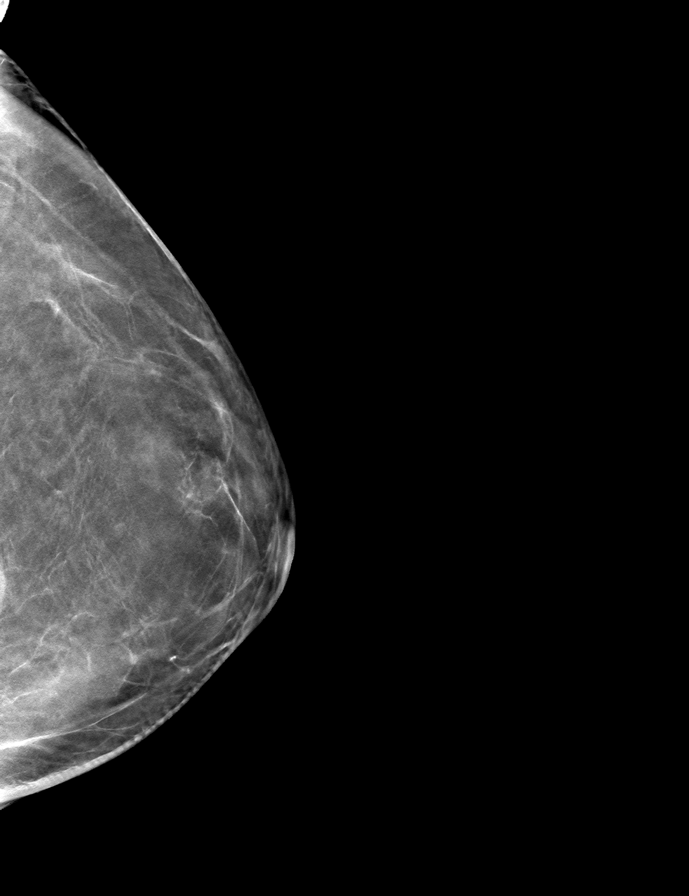

[L MLO tomo · tomo slice 40/79.0]
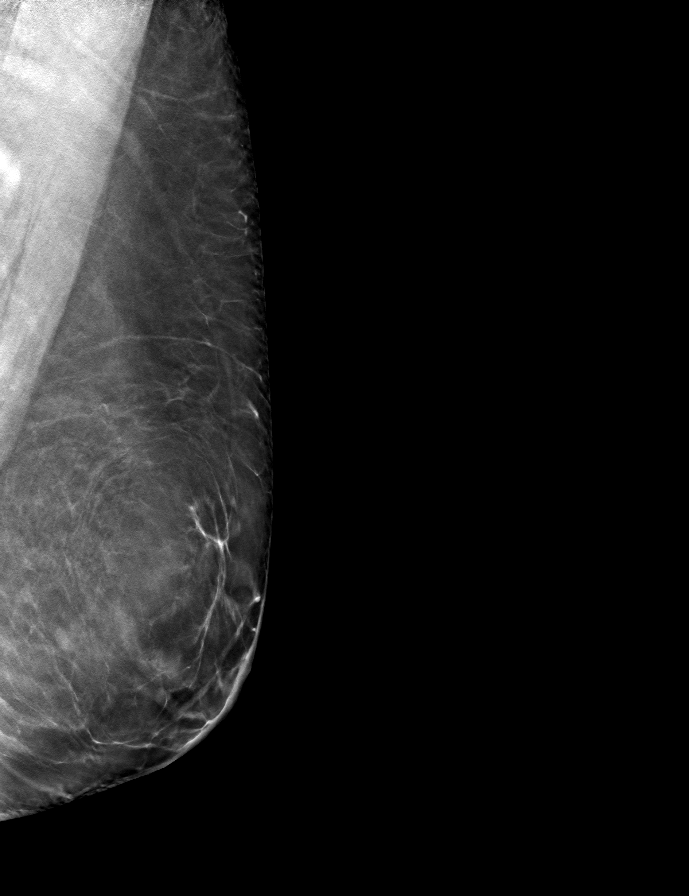

[R CC tomo · tomo slice 42/83.0]
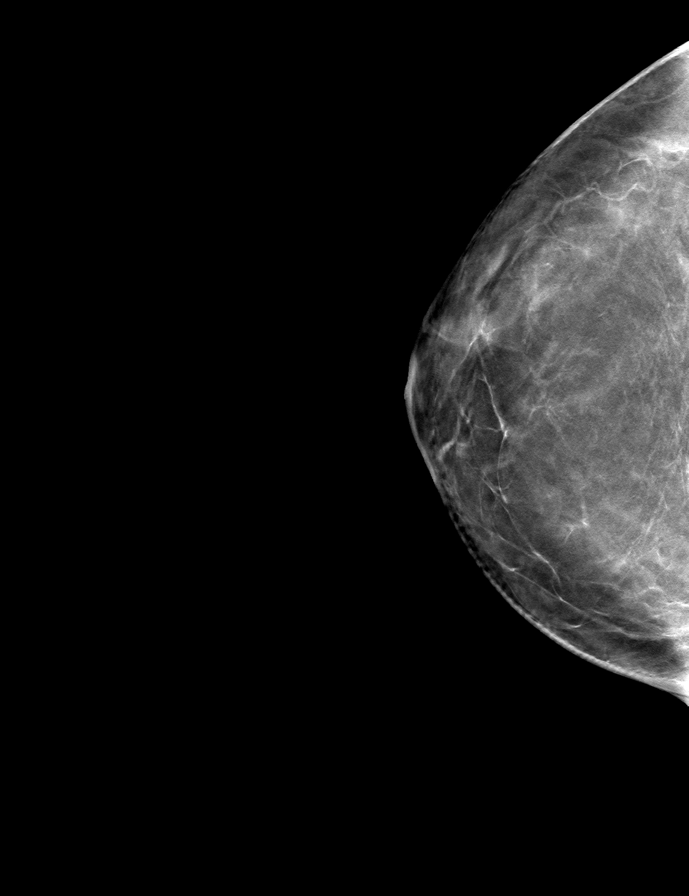

[R MLO tomo · tomo slice 41/80.0]
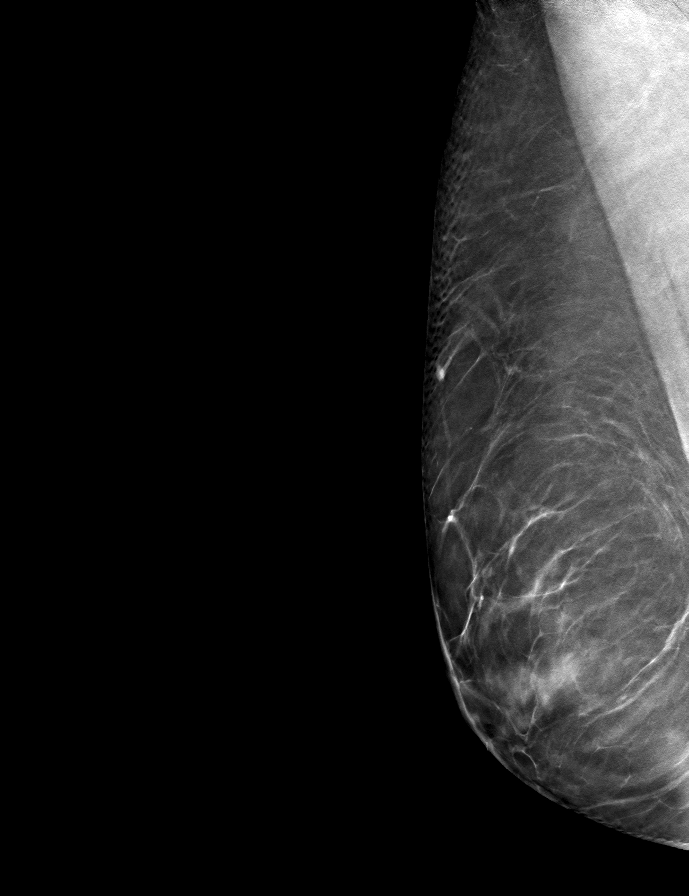

[9 of 24 positions shown; findings below may reference images not displayed]

ACR Breast Density Category b: There are scattered areas of
fibroglandular density.
FINDINGS: There are no findings suspicious for malignancy. Images were
processed with CAD.
IMPRESSION: No mammographic evidence of malignancy. A result letter of this
screening mammogram will be mailed directly to the patient.

RECOMMENDATION:
Screening mammogram in one year. (Code:[TQ])

BI-RADS CATEGORY  1: Negative.

## 2018-01-03 DIAGNOSIS — Z79899 Other long term (current) drug therapy: Secondary | ICD-10-CM | POA: Diagnosis not present

## 2018-01-03 DIAGNOSIS — Z0000 Encounter for general adult medical examination without abnormal findings: Secondary | ICD-10-CM | POA: Diagnosis not present

## 2018-01-03 DIAGNOSIS — Z1389 Encounter for screening for other disorder: Secondary | ICD-10-CM | POA: Diagnosis not present

## 2018-01-03 DIAGNOSIS — E7800 Pure hypercholesterolemia, unspecified: Secondary | ICD-10-CM | POA: Diagnosis not present

## 2018-01-03 DIAGNOSIS — Z23 Encounter for immunization: Secondary | ICD-10-CM | POA: Diagnosis not present

## 2018-01-29 ENCOUNTER — Ambulatory Visit: Payer: Medicare Other

## 2018-01-30 DIAGNOSIS — H25813 Combined forms of age-related cataract, bilateral: Secondary | ICD-10-CM | POA: Diagnosis not present

## 2018-01-30 DIAGNOSIS — H5213 Myopia, bilateral: Secondary | ICD-10-CM | POA: Diagnosis not present

## 2018-01-30 DIAGNOSIS — H52223 Regular astigmatism, bilateral: Secondary | ICD-10-CM | POA: Diagnosis not present

## 2018-07-23 DIAGNOSIS — I789 Disease of capillaries, unspecified: Secondary | ICD-10-CM | POA: Diagnosis not present

## 2018-07-23 DIAGNOSIS — L99 Other disorders of skin and subcutaneous tissue in diseases classified elsewhere: Secondary | ICD-10-CM | POA: Diagnosis not present

## 2018-07-23 DIAGNOSIS — L570 Actinic keratosis: Secondary | ICD-10-CM | POA: Diagnosis not present

## 2019-01-16 DIAGNOSIS — H524 Presbyopia: Secondary | ICD-10-CM | POA: Diagnosis not present

## 2019-01-16 DIAGNOSIS — H52223 Regular astigmatism, bilateral: Secondary | ICD-10-CM | POA: Diagnosis not present

## 2019-01-16 DIAGNOSIS — H2513 Age-related nuclear cataract, bilateral: Secondary | ICD-10-CM | POA: Diagnosis not present

## 2019-01-16 DIAGNOSIS — H5213 Myopia, bilateral: Secondary | ICD-10-CM | POA: Diagnosis not present

## 2019-01-30 DIAGNOSIS — H6122 Impacted cerumen, left ear: Secondary | ICD-10-CM | POA: Diagnosis not present

## 2019-01-30 DIAGNOSIS — Z1389 Encounter for screening for other disorder: Secondary | ICD-10-CM | POA: Diagnosis not present

## 2019-01-30 DIAGNOSIS — Z79899 Other long term (current) drug therapy: Secondary | ICD-10-CM | POA: Diagnosis not present

## 2019-01-30 DIAGNOSIS — Z23 Encounter for immunization: Secondary | ICD-10-CM | POA: Diagnosis not present

## 2019-01-30 DIAGNOSIS — E7800 Pure hypercholesterolemia, unspecified: Secondary | ICD-10-CM | POA: Diagnosis not present

## 2019-01-30 DIAGNOSIS — F5101 Primary insomnia: Secondary | ICD-10-CM | POA: Diagnosis not present

## 2019-01-30 DIAGNOSIS — R600 Localized edema: Secondary | ICD-10-CM | POA: Diagnosis not present

## 2019-01-30 DIAGNOSIS — Z0000 Encounter for general adult medical examination without abnormal findings: Secondary | ICD-10-CM | POA: Diagnosis not present

## 2019-02-12 ENCOUNTER — Encounter: Payer: Self-pay | Admitting: Internal Medicine

## 2019-02-17 DIAGNOSIS — D485 Neoplasm of uncertain behavior of skin: Secondary | ICD-10-CM | POA: Diagnosis not present

## 2019-02-17 DIAGNOSIS — C44629 Squamous cell carcinoma of skin of left upper limb, including shoulder: Secondary | ICD-10-CM | POA: Diagnosis not present

## 2019-02-20 ENCOUNTER — Other Ambulatory Visit: Payer: Self-pay | Admitting: Geriatric Medicine

## 2019-02-20 DIAGNOSIS — Z1231 Encounter for screening mammogram for malignant neoplasm of breast: Secondary | ICD-10-CM

## 2019-02-25 ENCOUNTER — Other Ambulatory Visit: Payer: Self-pay

## 2019-02-25 ENCOUNTER — Ambulatory Visit
Admission: RE | Admit: 2019-02-25 | Discharge: 2019-02-25 | Disposition: A | Discharge status: Home / Self Care | Payer: Medicare Other | Source: Ambulatory Visit | Attending: Geriatric Medicine | Admitting: Geriatric Medicine

## 2019-02-25 DIAGNOSIS — Z1231 Encounter for screening mammogram for malignant neoplasm of breast: Secondary | ICD-10-CM

## 2019-02-25 IMAGING — MG DIGITAL SCREENING BILAT W/ TOMO W/ CAD
8 series · 9 of 24 positions shown · non-contrast
Comparison: Previous exam(s).

CLINICAL DATA: Screening.

EXAM:
DIGITAL SCREENING BILATERAL MAMMOGRAM WITH TOMO AND CAD

[L CC synth-2D]
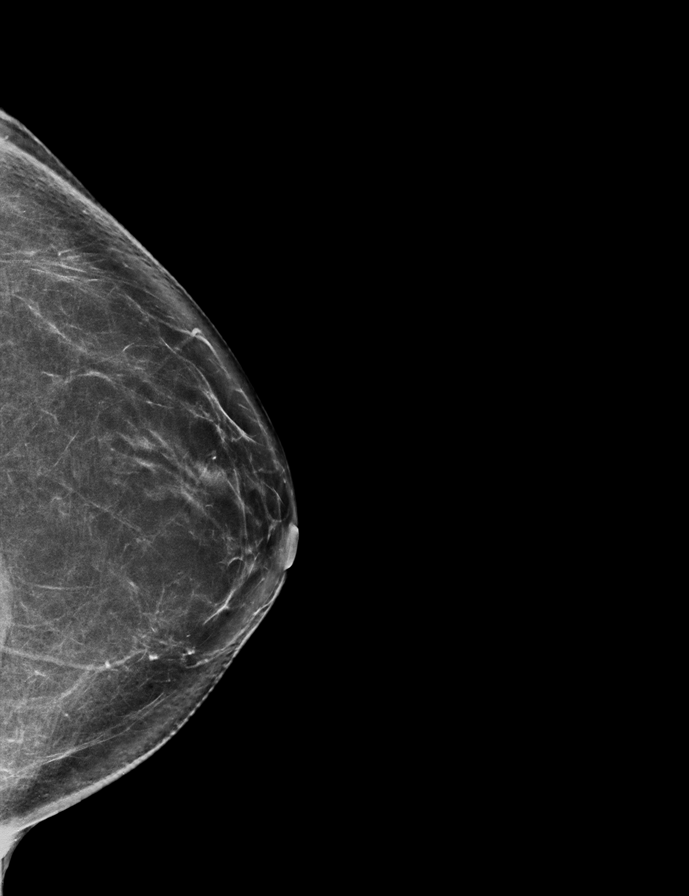

[L MLO synth-2D]
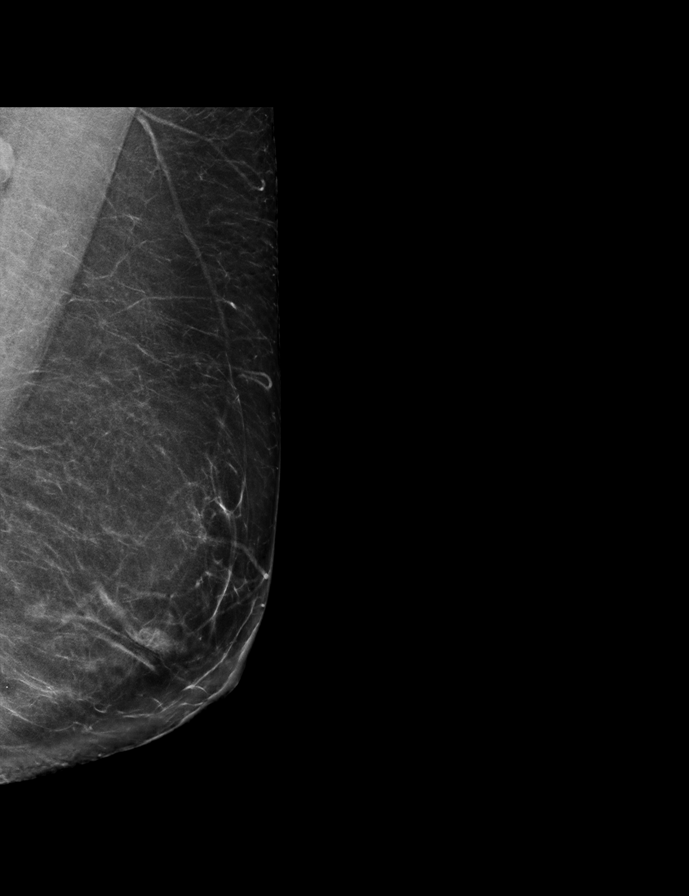

[R CC synth-2D]
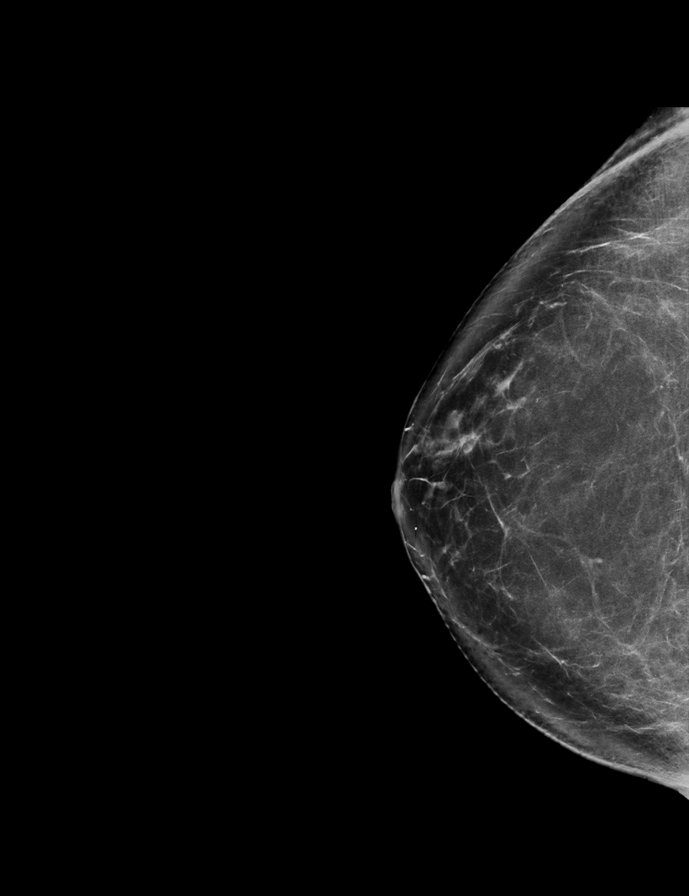

[R MLO synth-2D]
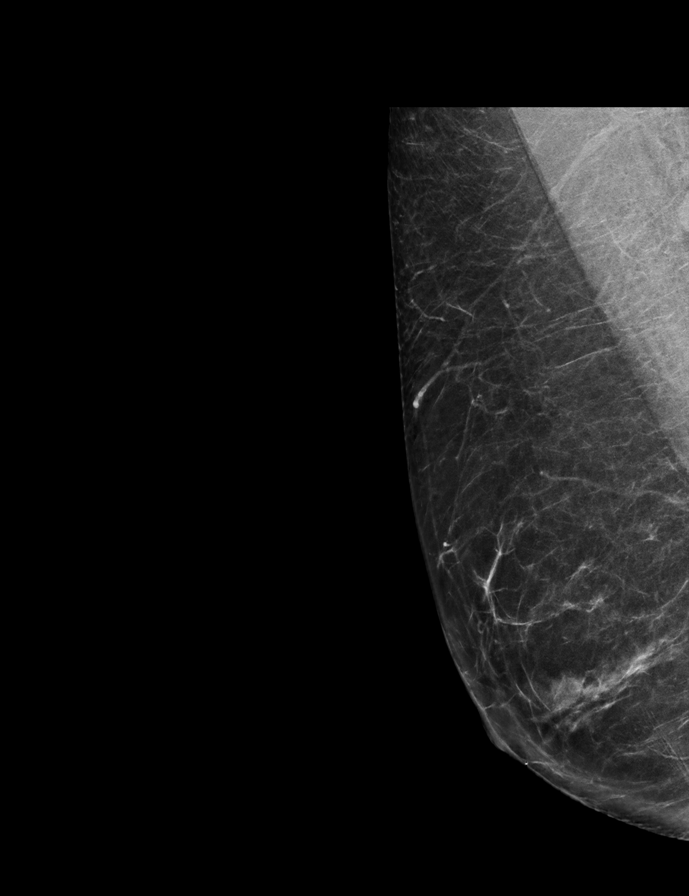

[R MLO tomo · 2 of 70 frames shown]
[frame 23/70]
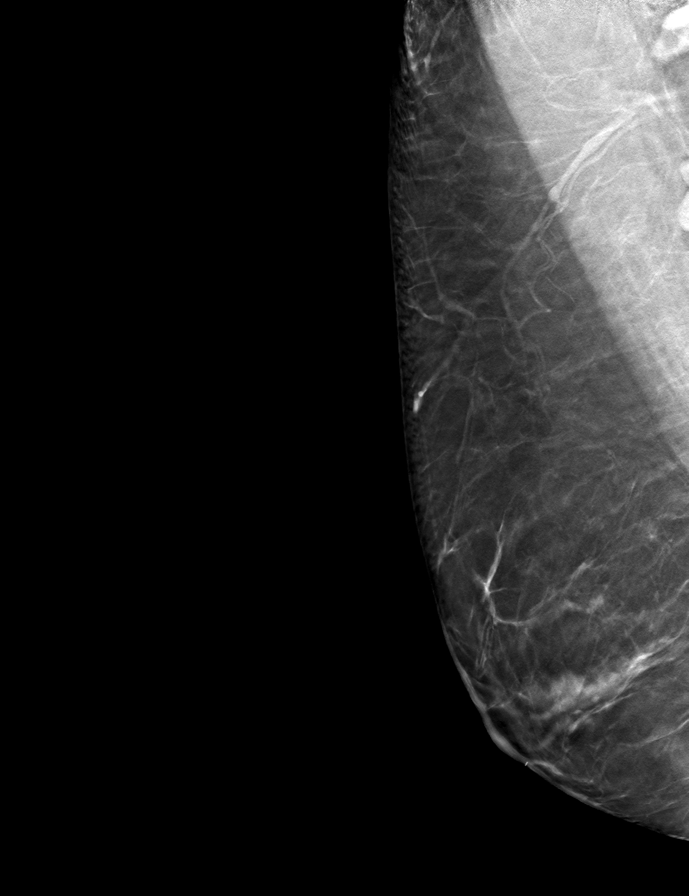
[frame 35/70]
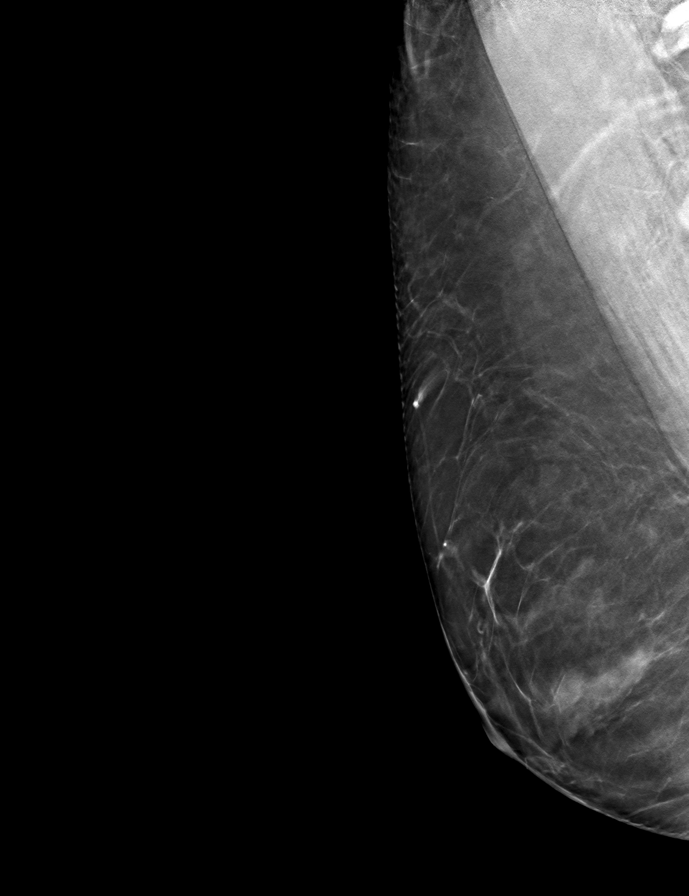

[R CC tomo · tomo slice 38/75.0]
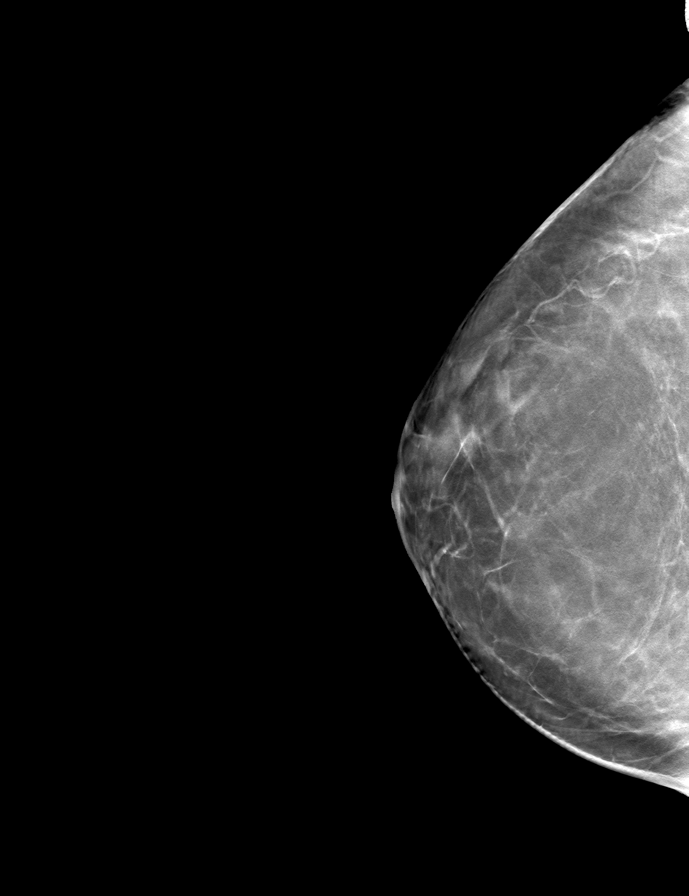

[L CC tomo · tomo slice 37/72.0]
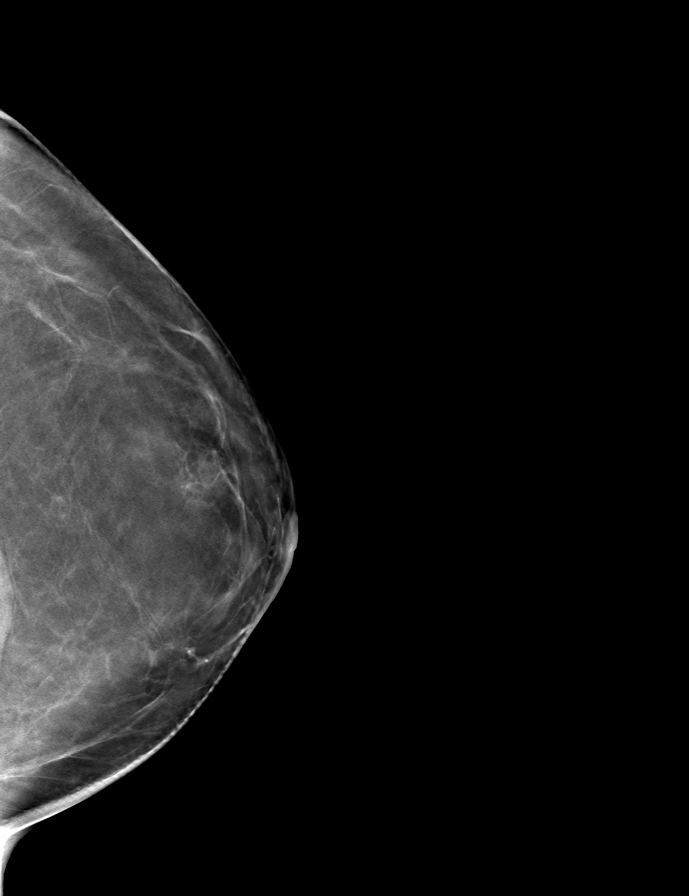

[L MLO tomo · tomo slice 34/67.0]
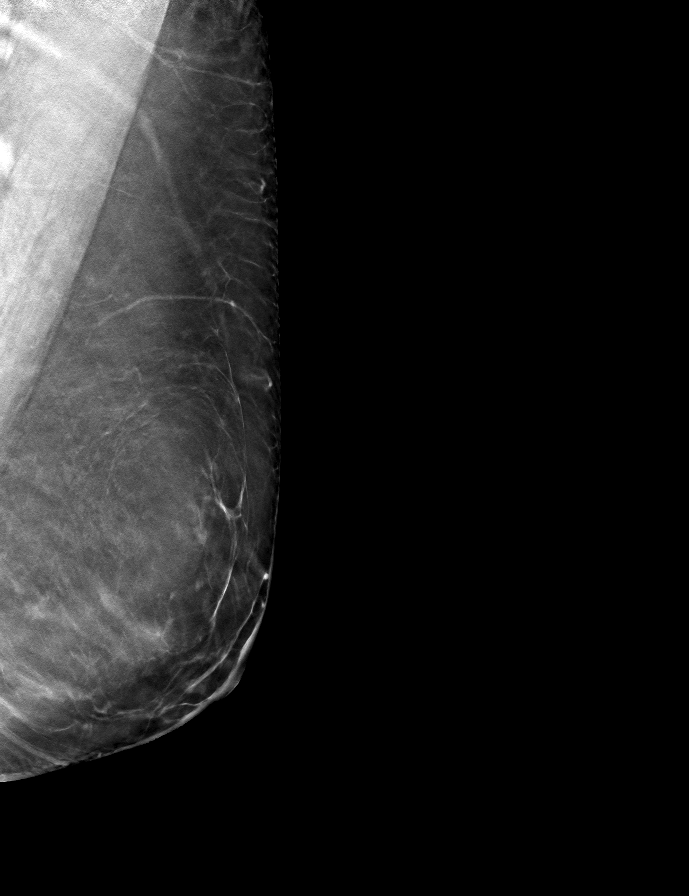

[9 of 24 positions shown; findings below may reference images not displayed]

ACR Breast Density Category b: There are scattered areas of
fibroglandular density.
FINDINGS: There are no findings suspicious for malignancy. Images were
processed with CAD.
IMPRESSION: No mammographic evidence of malignancy. A result letter of this
screening mammogram will be mailed directly to the patient.

RECOMMENDATION:
Screening mammogram in one year. (Code:[TQ])

BI-RADS CATEGORY  1: Negative.

## 2019-02-26 DIAGNOSIS — M79661 Pain in right lower leg: Secondary | ICD-10-CM | POA: Diagnosis not present

## 2019-02-26 DIAGNOSIS — R269 Unspecified abnormalities of gait and mobility: Secondary | ICD-10-CM | POA: Diagnosis not present

## 2019-02-26 DIAGNOSIS — M79604 Pain in right leg: Secondary | ICD-10-CM | POA: Diagnosis not present

## 2019-03-05 DIAGNOSIS — M79604 Pain in right leg: Secondary | ICD-10-CM | POA: Diagnosis not present

## 2019-03-05 DIAGNOSIS — R269 Unspecified abnormalities of gait and mobility: Secondary | ICD-10-CM | POA: Diagnosis not present

## 2019-03-05 DIAGNOSIS — M79661 Pain in right lower leg: Secondary | ICD-10-CM | POA: Diagnosis not present

## 2019-03-10 DIAGNOSIS — M79661 Pain in right lower leg: Secondary | ICD-10-CM | POA: Diagnosis not present

## 2019-03-10 DIAGNOSIS — M79604 Pain in right leg: Secondary | ICD-10-CM | POA: Diagnosis not present

## 2019-03-10 DIAGNOSIS — R269 Unspecified abnormalities of gait and mobility: Secondary | ICD-10-CM | POA: Diagnosis not present

## 2019-03-20 DIAGNOSIS — M79661 Pain in right lower leg: Secondary | ICD-10-CM | POA: Diagnosis not present

## 2019-03-20 DIAGNOSIS — R269 Unspecified abnormalities of gait and mobility: Secondary | ICD-10-CM | POA: Diagnosis not present

## 2019-03-20 DIAGNOSIS — M79604 Pain in right leg: Secondary | ICD-10-CM | POA: Diagnosis not present

## 2019-03-25 DIAGNOSIS — M79604 Pain in right leg: Secondary | ICD-10-CM | POA: Diagnosis not present

## 2019-03-25 DIAGNOSIS — R269 Unspecified abnormalities of gait and mobility: Secondary | ICD-10-CM | POA: Diagnosis not present

## 2019-03-25 DIAGNOSIS — M79661 Pain in right lower leg: Secondary | ICD-10-CM | POA: Diagnosis not present

## 2019-04-04 ENCOUNTER — Ambulatory Visit: Payer: Medicare Other | Attending: Internal Medicine

## 2019-04-04 DIAGNOSIS — Z23 Encounter for immunization: Secondary | ICD-10-CM | POA: Insufficient documentation

## 2019-04-08 ENCOUNTER — Ambulatory Visit: Payer: Medicare Other

## 2019-04-15 ENCOUNTER — Ambulatory Visit: Payer: Medicare Other

## 2019-04-15 DIAGNOSIS — R269 Unspecified abnormalities of gait and mobility: Secondary | ICD-10-CM | POA: Diagnosis not present

## 2019-04-15 DIAGNOSIS — M79661 Pain in right lower leg: Secondary | ICD-10-CM | POA: Diagnosis not present

## 2019-04-15 DIAGNOSIS — M79604 Pain in right leg: Secondary | ICD-10-CM | POA: Diagnosis not present

## 2019-04-22 DIAGNOSIS — M79604 Pain in right leg: Secondary | ICD-10-CM | POA: Diagnosis not present

## 2019-04-22 DIAGNOSIS — M79661 Pain in right lower leg: Secondary | ICD-10-CM | POA: Diagnosis not present

## 2019-04-22 DIAGNOSIS — R269 Unspecified abnormalities of gait and mobility: Secondary | ICD-10-CM | POA: Diagnosis not present

## 2019-04-24 ENCOUNTER — Ambulatory Visit: Payer: Medicare Other | Attending: Internal Medicine

## 2019-04-24 DIAGNOSIS — Z23 Encounter for immunization: Secondary | ICD-10-CM | POA: Insufficient documentation

## 2019-04-24 NOTE — Progress Notes (Signed)
   Covid-19 Vaccination Clinic  Name:  Nichole Simpson    MRN: [id number] DOB: 08/28/1950  04/24/2019  Nichole Simpson was observed post Covid-19 immunization for 15 minutes without incidence. She was provided with Vaccine Information Sheet and instruction to access the V-Safe system.   Nichole Simpson was instructed to call 911 with any severe reactions post vaccine: Marland Kitchen Difficulty breathing  . Swelling of your face and throat  . A fast heartbeat  . A bad rash all over your body  . Dizziness and weakness    Immunizations Administered    Name Date Dose VIS Date Route   Pfizer COVID-19 Vaccine 04/24/2019  8:19 AM 0.3 mL 02/21/2019 Intramuscular   Manufacturer: [place]   Lot: [id number]   [place]: [id number]

## 2019-04-25 ENCOUNTER — Ambulatory Visit: Payer: Medicare Other

## 2019-04-25 DIAGNOSIS — D225 Melanocytic nevi of trunk: Secondary | ICD-10-CM | POA: Diagnosis not present

## 2019-04-25 DIAGNOSIS — L570 Actinic keratosis: Secondary | ICD-10-CM | POA: Diagnosis not present

## 2019-04-25 DIAGNOSIS — L814 Other melanin hyperpigmentation: Secondary | ICD-10-CM | POA: Diagnosis not present

## 2019-04-25 DIAGNOSIS — L821 Other seborrheic keratosis: Secondary | ICD-10-CM | POA: Diagnosis not present

## 2019-04-25 DIAGNOSIS — D1801 Hemangioma of skin and subcutaneous tissue: Secondary | ICD-10-CM | POA: Diagnosis not present

## 2019-04-25 DIAGNOSIS — Z85828 Personal history of other malignant neoplasm of skin: Secondary | ICD-10-CM | POA: Diagnosis not present

## 2019-11-18 DIAGNOSIS — Z23 Encounter for immunization: Secondary | ICD-10-CM | POA: Diagnosis not present

## 2019-11-24 ENCOUNTER — Encounter: Payer: Self-pay | Admitting: Internal Medicine

## 2020-01-06 ENCOUNTER — Other Ambulatory Visit: Payer: Self-pay

## 2020-01-06 ENCOUNTER — Ambulatory Visit (AMBULATORY_SURGERY_CENTER): Payer: Self-pay | Admitting: *Deleted

## 2020-01-06 VITALS — Ht 67.0 in | Wt 144.0 lb

## 2020-01-06 DIAGNOSIS — Z1211 Encounter for screening for malignant neoplasm of colon: Secondary | ICD-10-CM

## 2020-01-06 MED ORDER — SUTAB 1479-225-188 MG PO TABS: 1.0000 | ORAL_TABLET | ORAL | 0 refills | Status: DC

## 2020-01-06 NOTE — Progress Notes (Signed)
Patient denies any allergies to egg or soy products. Patient denies complications with anesthesia/sedation.  Patient denies oxygen use at home and denies diet medications. Patient denies information on colonoscopy procedure.  Patient had both covid vaccinations, last one 04/24/19.

## 2020-01-15 ENCOUNTER — Other Ambulatory Visit: Payer: Self-pay

## 2020-01-15 ENCOUNTER — Ambulatory Visit (AMBULATORY_SURGERY_CENTER): Payer: Medicare Other | Admitting: Internal Medicine

## 2020-01-15 ENCOUNTER — Encounter: Payer: Self-pay | Admitting: Internal Medicine

## 2020-01-15 VITALS — BP 118/82 | HR 59 | Temp 97.9°F | Resp 14 | Ht 66.0 in | Wt 144.0 lb

## 2020-01-15 DIAGNOSIS — Z1211 Encounter for screening for malignant neoplasm of colon: Secondary | ICD-10-CM | POA: Diagnosis not present

## 2020-01-15 DIAGNOSIS — D120 Benign neoplasm of cecum: Secondary | ICD-10-CM

## 2020-01-15 MED ORDER — SODIUM CHLORIDE 0.9 % IV SOLN: 500.0000 mL | Freq: Once | INTRAVENOUS | Status: DC

## 2020-01-15 NOTE — Op Note (Signed)
[facility] Patient Name: Nichole Simpson Procedure Date: 01/15/2020 7:57 AM MRN: [id number] Endoscopist: Docia Chuck. Henrene Pastor MD, MD Age: 69 Referring MD:  Date of Birth: [birth year] Gender: Female Account #: [id number] Procedure:                Colonoscopy with cold snare polypectomy x 1 Indications:              Screening for colorectal malignant neoplasm.                            Previous examinations 2005 and 2010 both negative                            for neoplasia Medicines:                Monitored Anesthesia Care Procedure:                Pre-Anesthesia Assessment:                           - Prior to the procedure, a History and Physical                            was performed, and patient medications and                            allergies were reviewed. The patient's tolerance of                            previous anesthesia was also reviewed. The risks                            and benefits of the procedure and the sedation                            options and risks were discussed with the patient.                            All questions were answered, and informed consent                            was obtained. Prior Anticoagulants: The patient has                            taken no previous anticoagulant or antiplatelet                            agents. ASA Grade Assessment: II - A patient with                            mild systemic disease. After reviewing the risks                            and benefits, the patient was deemed in  satisfactory condition to undergo the procedure.                           After obtaining informed consent, the colonoscope                            was passed under direct vision. Throughout the                            procedure, the patient's blood pressure, pulse, and                            oxygen saturations were monitored continuously. The                            Colonoscope was  introduced through the anus and                            advanced to the the cecum, identified by                            appendiceal orifice and ileocecal valve. The                            ileocecal valve, appendiceal orifice, and rectum                            were photographed. The quality of the bowel                            preparation was excellent. The colonoscopy was                            performed without difficulty. The patient tolerated                            the procedure well. The bowel preparation used was                            SUPREP/tablets via split dose instruction. Scope In: 8:22:16 AM Scope Out: 8:35:51 AM Scope Withdrawal Time: 0 hours 9 minutes 39 seconds  Total Procedure Duration: 0 hours 13 minutes 35 seconds  Findings:                 A 1 mm polyp was found in the cecum. The polyp was                            removed with a cold snare. Resection and retrieval                            were complete.                           Multiple medium-mouthed diverticula were found in  the sigmoid colon.                           The exam was otherwise without abnormality on                            direct and retroflexion views. Hypertrophic anal                            papilla noted. Complications:            No immediate complications. Estimated blood loss:                            None. Estimated Blood Loss:     Estimated blood loss: none. Impression:               - One 1 mm polyp in the cecum, removed with a cold                            snare. Resected and retrieved.                           - Diverticulosis in the sigmoid colon.                           - The examination was otherwise normal on direct                            and retroflexion views. Recommendation:           - Repeat colonoscopy in 10 years for surveillance.                           - Patient has a contact number available for                             emergencies. The signs and symptoms of potential                            delayed complications were discussed with the                            patient. Return to normal activities tomorrow.                            Written discharge instructions were provided to the                            patient.                           - Resume previous diet.                           - Continue present medications.                           -  Await pathology results. Docia Chuck. Henrene Pastor MD, MD 01/15/2020 8:46:24 AM This report has been signed electronically.

## 2020-01-15 NOTE — Progress Notes (Signed)
Report to PACU, RN, vss, BBS= Clear.  

## 2020-01-15 NOTE — Patient Instructions (Signed)
Handouts provided:  Polyps and Diverticulosis  YOU HAD AN ENDOSCOPIC PROCEDURE TODAY AT THE [facility]:   Refer to the procedure report that was given to you for any specific questions about what was found during the examination.  If the procedure report does not answer your questions, please call your gastroenterologist to clarify.  If you requested that your care partner not be given the details of your procedure findings, then the procedure report has been included in a sealed envelope for you to review at your convenience later.  YOU SHOULD EXPECT: Some feelings of bloating in the abdomen. Passage of more gas than usual.  Walking can help get rid of the air that was put into your GI tract during the procedure and reduce the bloating. If you had a lower endoscopy (such as a colonoscopy or flexible sigmoidoscopy) you may notice spotting of blood in your stool or on the toilet paper. If you underwent a bowel prep for your procedure, you may not have a normal bowel movement for a few days.  Please Note:  You might notice some irritation and congestion in your nose or some drainage.  This is from the oxygen used during your procedure.  There is no need for concern and it should clear up in a day or so.  SYMPTOMS TO REPORT IMMEDIATELY:  Following lower endoscopy (colonoscopy or flexible sigmoidoscopy):  Excessive amounts of blood in the stool  Significant tenderness or worsening of abdominal pains  Swelling of the abdomen that is new, acute  Fever of 100F or higher  For urgent or emergent issues, a gastroenterologist can be reached at any hour by calling [phone]. Do not use MyChart messaging for urgent concerns.    DIET:  We do recommend a small meal at first, but then you may proceed to your regular diet.  Drink plenty of fluids but you should avoid alcoholic beverages for 24 hours.  ACTIVITY:  You should plan to take it easy for the rest of today and you should NOT DRIVE  or use heavy machinery until tomorrow (because of the sedation medicines used during the test).    FOLLOW UP: Our staff will call the number listed on your records 48-72 hours following your procedure to check on you and address any questions or concerns that you may have regarding the information given to you following your procedure. If we do not reach you, we will leave a message.  We will attempt to reach you two times.  During this call, we will ask if you have developed any symptoms of COVID 19. If you develop any symptoms (ie: fever, flu-like symptoms, shortness of breath, cough etc.) before then, please call [phone].  If you test positive for Covid 19 in the 2 weeks post procedure, please call and report this information to us.    If any biopsies were taken you will be contacted by phone or by letter within the next 1-3 weeks.  Please call us at [phone] if you have not heard about the biopsies in 3 weeks.    SIGNATURES/CONFIDENTIALITY: You and/or your care partner have signed paperwork which will be entered into your electronic medical record.  These signatures attest to the fact that that the information above on your After Visit Summary has been reviewed and is understood.  Full responsibility of the confidentiality of this discharge information lies with you and/or your care-partner.  

## 2020-01-19 ENCOUNTER — Telehealth: Payer: Self-pay | Admitting: *Deleted

## 2020-01-19 NOTE — Telephone Encounter (Signed)
No answer for post procedure call back. Left message for patient to call back with questions or concerns.

## 2020-01-19 NOTE — Telephone Encounter (Signed)
First follow up call made. Left message. 

## 2020-01-20 ENCOUNTER — Encounter: Payer: Self-pay | Admitting: Internal Medicine

## 2020-01-30 ENCOUNTER — Other Ambulatory Visit: Payer: Self-pay | Admitting: Geriatric Medicine

## 2020-01-30 DIAGNOSIS — Z1231 Encounter for screening mammogram for malignant neoplasm of breast: Secondary | ICD-10-CM

## 2020-02-02 DIAGNOSIS — F5101 Primary insomnia: Secondary | ICD-10-CM | POA: Diagnosis not present

## 2020-02-02 DIAGNOSIS — Z1159 Encounter for screening for other viral diseases: Secondary | ICD-10-CM | POA: Diagnosis not present

## 2020-02-02 DIAGNOSIS — Z0000 Encounter for general adult medical examination without abnormal findings: Secondary | ICD-10-CM | POA: Diagnosis not present

## 2020-02-02 DIAGNOSIS — Z1389 Encounter for screening for other disorder: Secondary | ICD-10-CM | POA: Diagnosis not present

## 2020-02-02 DIAGNOSIS — Z23 Encounter for immunization: Secondary | ICD-10-CM | POA: Diagnosis not present

## 2020-02-02 DIAGNOSIS — Z79899 Other long term (current) drug therapy: Secondary | ICD-10-CM | POA: Diagnosis not present

## 2020-02-02 DIAGNOSIS — N3941 Urge incontinence: Secondary | ICD-10-CM | POA: Diagnosis not present

## 2020-02-02 DIAGNOSIS — E7800 Pure hypercholesterolemia, unspecified: Secondary | ICD-10-CM | POA: Diagnosis not present

## 2020-02-26 ENCOUNTER — Ambulatory Visit: Payer: Medicare Other

## 2020-03-12 DIAGNOSIS — U071 COVID-19: Secondary | ICD-10-CM | POA: Diagnosis not present

## 2020-03-24 DIAGNOSIS — H52223 Regular astigmatism, bilateral: Secondary | ICD-10-CM | POA: Diagnosis not present

## 2020-03-24 DIAGNOSIS — H01009 Unspecified blepharitis unspecified eye, unspecified eyelid: Secondary | ICD-10-CM | POA: Diagnosis not present

## 2020-03-24 DIAGNOSIS — H25013 Cortical age-related cataract, bilateral: Secondary | ICD-10-CM | POA: Diagnosis not present

## 2020-03-24 DIAGNOSIS — H524 Presbyopia: Secondary | ICD-10-CM | POA: Diagnosis not present

## 2020-03-24 DIAGNOSIS — H5213 Myopia, bilateral: Secondary | ICD-10-CM | POA: Diagnosis not present

## 2020-04-12 ENCOUNTER — Ambulatory Visit
Admission: RE | Admit: 2020-04-12 | Discharge: 2020-04-12 | Disposition: A | Discharge status: Home / Self Care | Payer: Medicare Other | Source: Ambulatory Visit | Attending: Geriatric Medicine | Admitting: Geriatric Medicine

## 2020-04-12 ENCOUNTER — Other Ambulatory Visit: Payer: Self-pay

## 2020-04-12 DIAGNOSIS — Z1231 Encounter for screening mammogram for malignant neoplasm of breast: Secondary | ICD-10-CM | POA: Diagnosis not present

## 2020-04-12 IMAGING — MG MM DIGITAL SCREENING BILAT W/ TOMO AND CAD
8 series · 9 of 24 positions shown · non-contrast
Comparison: Previous exam(s).

CLINICAL DATA: Screening.

EXAM:
DIGITAL SCREENING BILATERAL MAMMOGRAM WITH TOMO AND CAD

[L CC synth-2D]
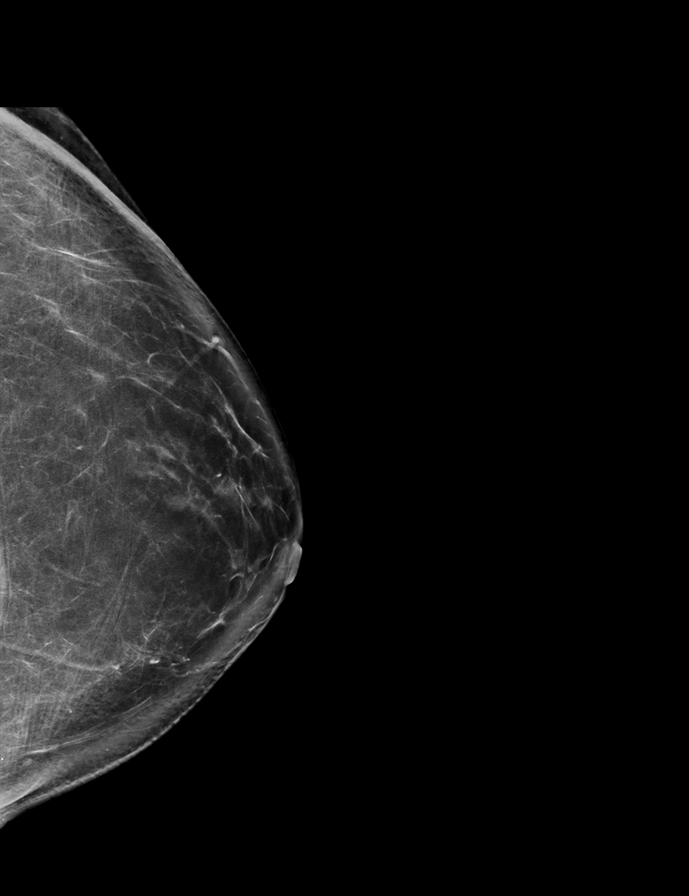

[R MLO synth-2D]
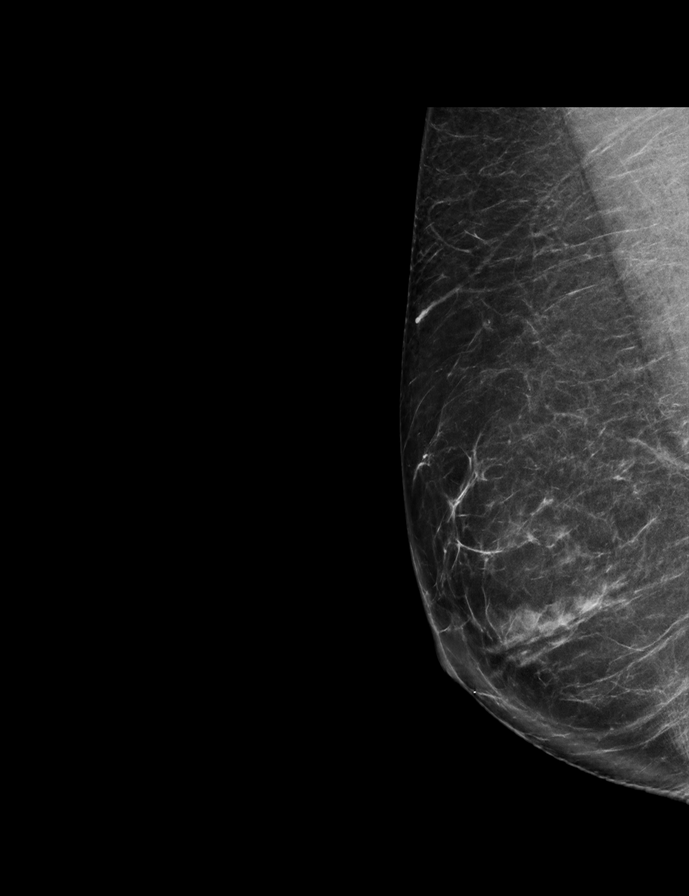

[L MLO synth-2D]
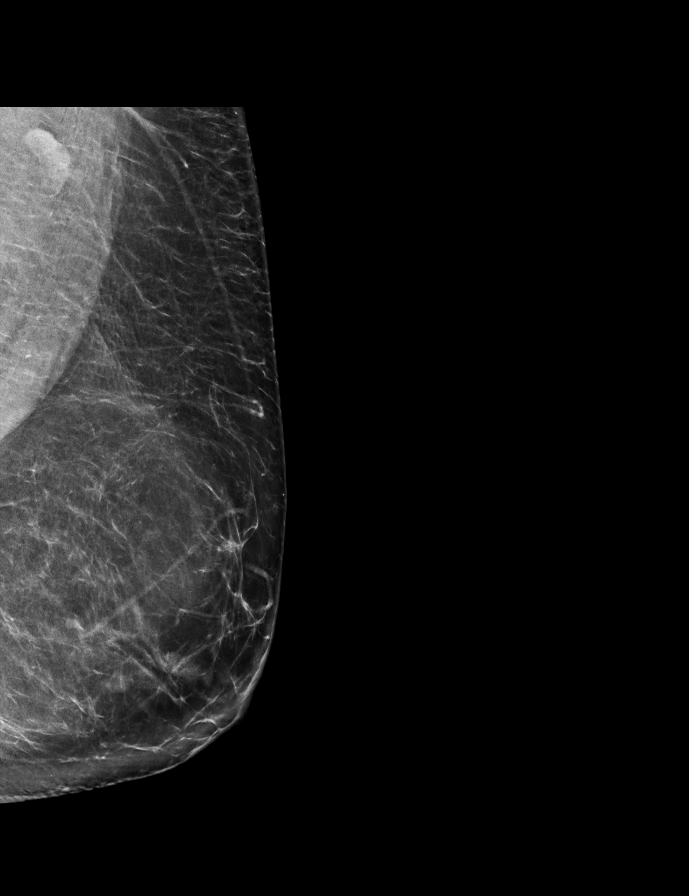

[R CC synth-2D]
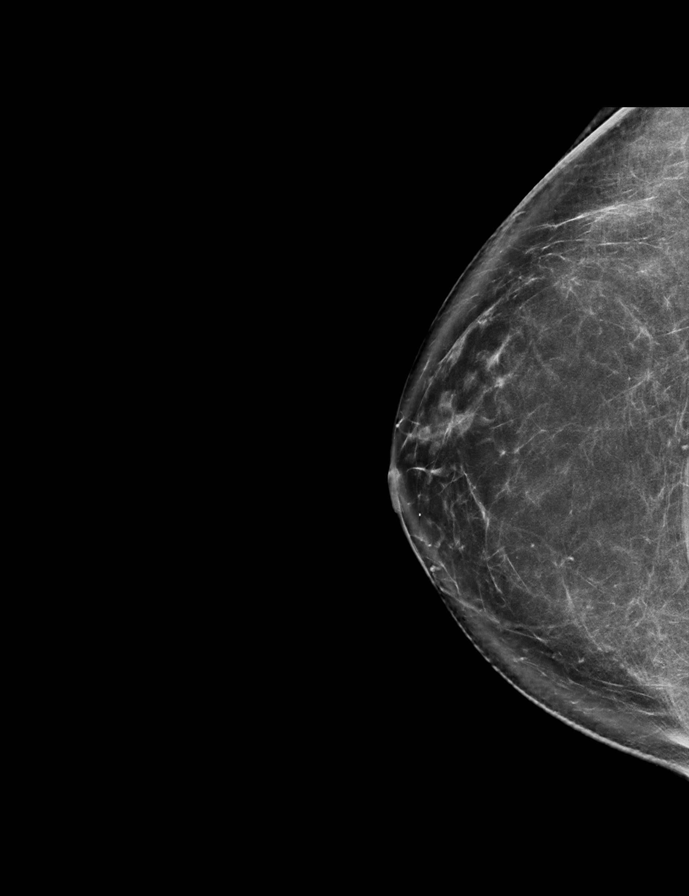

[L MLO tomo · 2 of 74 frames shown]
[frame 24/74]
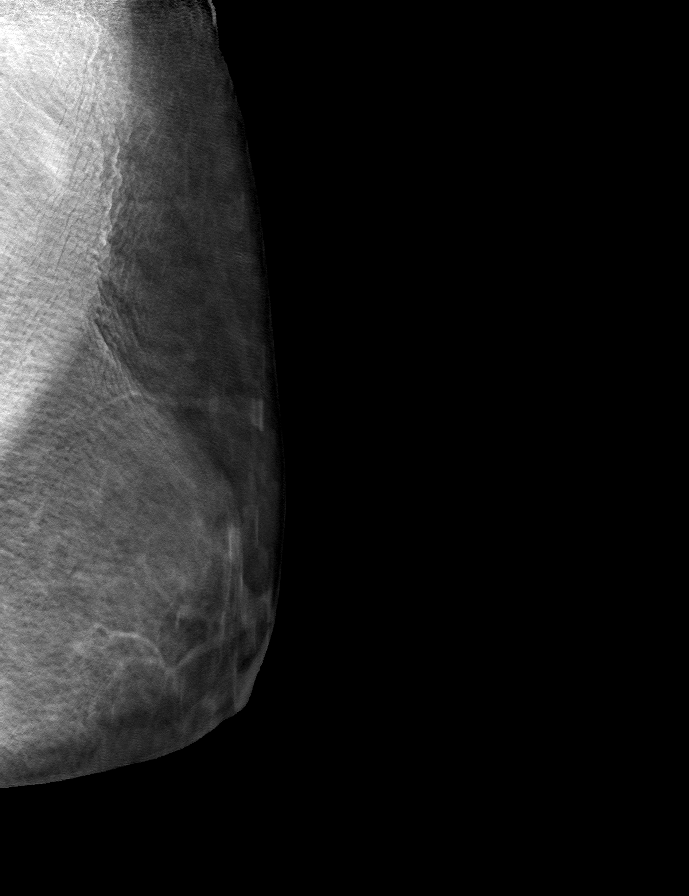
[frame 37/74]
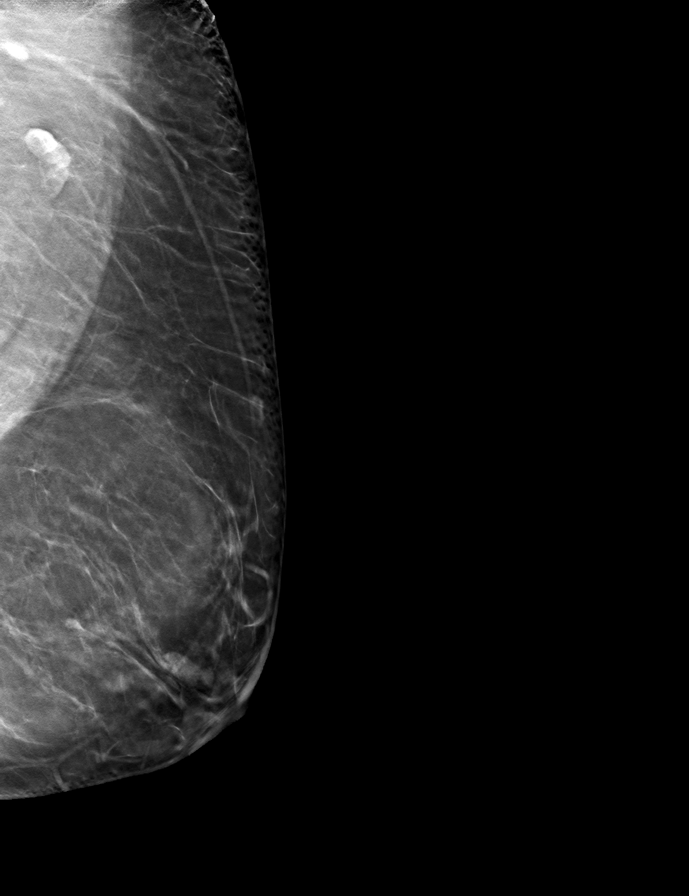

[L CC tomo · tomo slice 41/82.0]
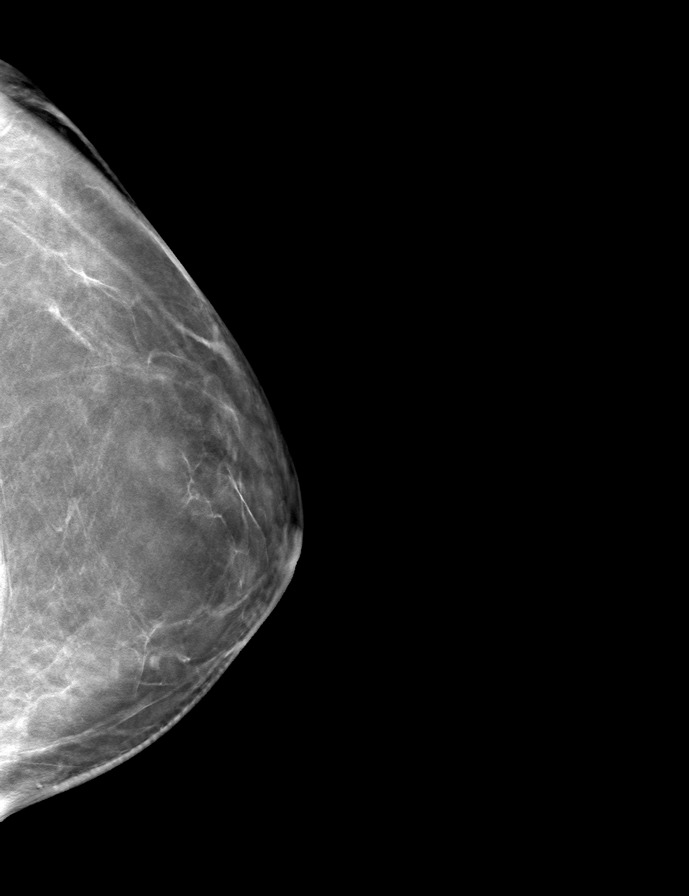

[R CC tomo · tomo slice 41/81.0]
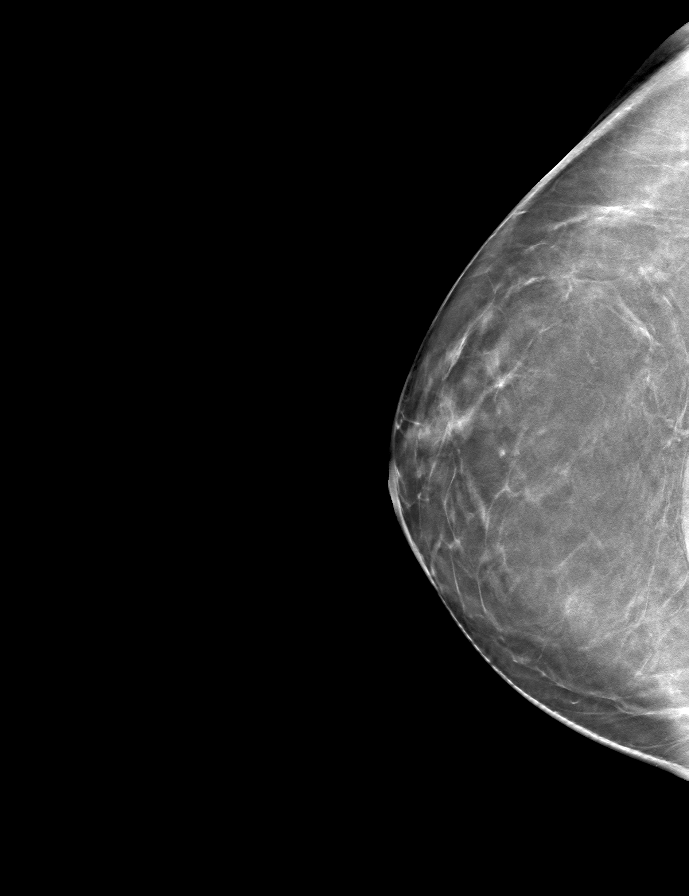

[R MLO tomo · tomo slice 37/74.0]
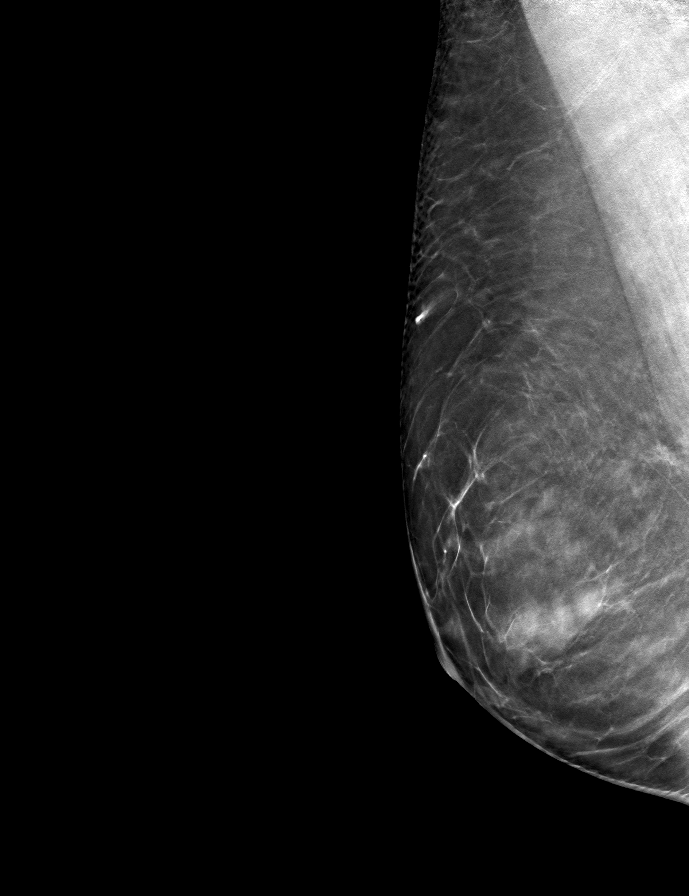

[9 of 24 positions shown; findings below may reference images not displayed]

ACR Breast Density Category b: There are scattered areas of
fibroglandular density.
FINDINGS: There are no findings suspicious for malignancy. The images were
evaluated with computer-aided detection.
IMPRESSION: No mammographic evidence of malignancy. A result letter of this
screening mammogram will be mailed directly to the patient.

RECOMMENDATION:
Screening mammogram in one year. (Code:[MT])

BI-RADS CATEGORY  1: Negative.

## 2020-04-13 DIAGNOSIS — H6122 Impacted cerumen, left ear: Secondary | ICD-10-CM | POA: Diagnosis not present

## 2020-04-13 DIAGNOSIS — H6982 Other specified disorders of Eustachian tube, left ear: Secondary | ICD-10-CM | POA: Diagnosis not present

## 2020-05-04 DIAGNOSIS — D1801 Hemangioma of skin and subcutaneous tissue: Secondary | ICD-10-CM | POA: Diagnosis not present

## 2020-05-04 DIAGNOSIS — L570 Actinic keratosis: Secondary | ICD-10-CM | POA: Diagnosis not present

## 2020-05-04 DIAGNOSIS — L814 Other melanin hyperpigmentation: Secondary | ICD-10-CM | POA: Diagnosis not present

## 2020-05-04 DIAGNOSIS — Z85828 Personal history of other malignant neoplasm of skin: Secondary | ICD-10-CM | POA: Diagnosis not present

## 2020-05-04 DIAGNOSIS — L821 Other seborrheic keratosis: Secondary | ICD-10-CM | POA: Diagnosis not present

## 2020-05-04 DIAGNOSIS — D2261 Melanocytic nevi of right upper limb, including shoulder: Secondary | ICD-10-CM | POA: Diagnosis not present

## 2020-08-03 ENCOUNTER — Other Ambulatory Visit (HOSPITAL_COMMUNITY): Payer: Self-pay | Admitting: Neurological Surgery

## 2020-08-03 DIAGNOSIS — M5416 Radiculopathy, lumbar region: Secondary | ICD-10-CM

## 2020-08-06 ENCOUNTER — Ambulatory Visit (HOSPITAL_COMMUNITY): Payer: Medicare Other

## 2020-08-20 ENCOUNTER — Other Ambulatory Visit: Payer: Self-pay

## 2020-08-20 ENCOUNTER — Ambulatory Visit (HOSPITAL_COMMUNITY)
Admission: RE | Admit: 2020-08-20 | Discharge: 2020-08-20 | Disposition: A | Discharge status: Home / Self Care | Payer: Medicare Other | Source: Ambulatory Visit | Attending: Neurological Surgery | Admitting: Neurological Surgery

## 2020-08-20 DIAGNOSIS — M5416 Radiculopathy, lumbar region: Secondary | ICD-10-CM | POA: Insufficient documentation

## 2020-08-20 DIAGNOSIS — M545 Low back pain, unspecified: Secondary | ICD-10-CM | POA: Diagnosis not present

## 2020-08-20 IMAGING — MR MR LUMBAR SPINE WO/W CM
7 of 10 series · 25 of 48 positions shown · IV contrast (gadavist)
Comparison: MRI lumbar spine [DATE].

CLINICAL DATA: Low back pain. History of lumbar spine surgery.

EXAM:
MRI LUMBAR SPINE WITHOUT AND WITH CONTRAST
TECHNIQUE: Multiplanar and multiecho pulse sequences of the lumbar spine were
obtained without and with intravenous contrast.
CONTRAST:  6.5mL GADAVIST GADOBUTROL 1 MMOL/ML IV SOLN

[Series 5: T1 · sagittal · 4.0mm · 0.81mm/px · 2 of 15 slices shown (1 of 2)]
[im 1/15]
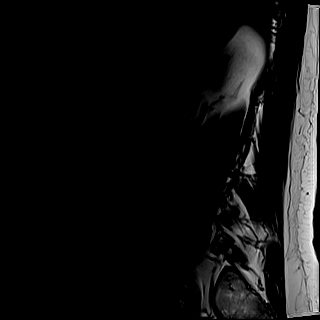
[im 15/15]
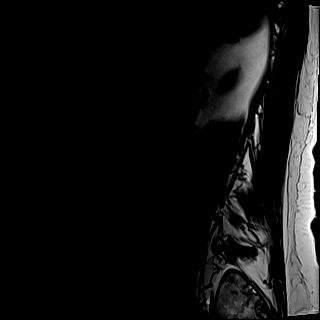

[Series 6: STIR · sagittal · 4.0mm · 0.51mm/px · 2 of 15 slices shown]
[im 1/15]
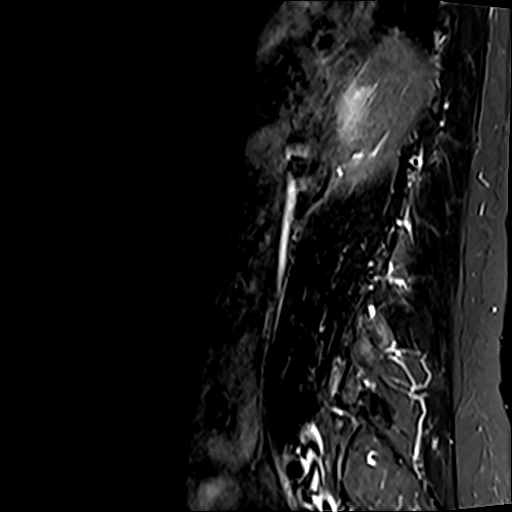
[im 15/15]
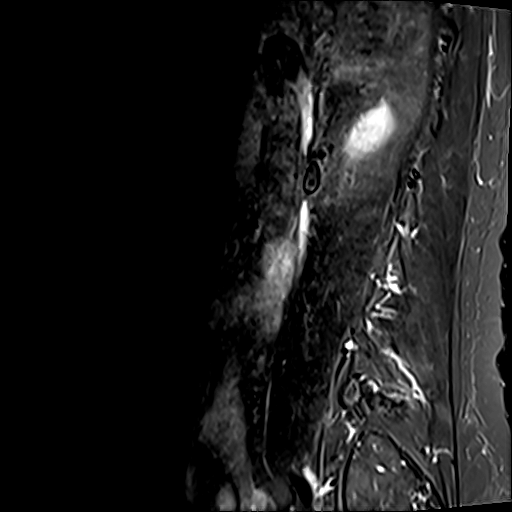

[Series 7: T2 · axial · 4.0mm · 0.62mm/px · z∈[-132,+101]mm · 6 of 45 slices shown]
[im 1/45]
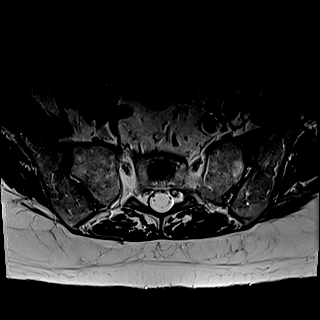
[im 9/45]
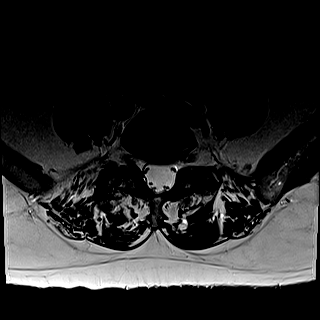
[im 18/45]
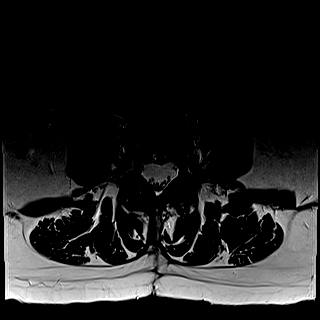
[im 27/45]
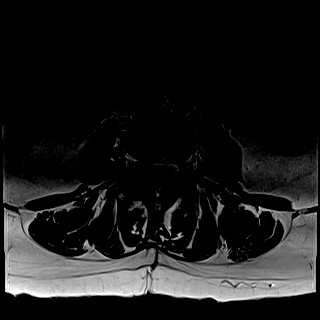
[im 36/45]
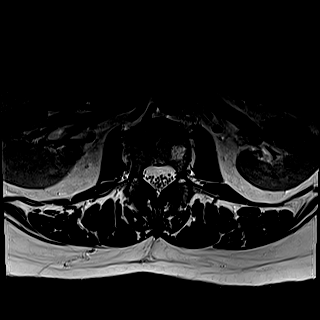
[im 45/45]
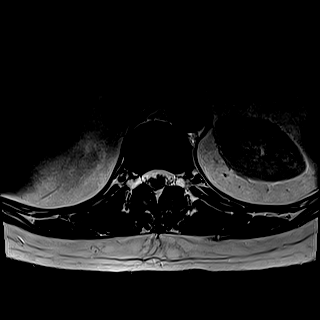

[Series 8: T1 · axial · 4.0mm · 0.39mm/px · z∈[-132,+101]mm · 6 of 45 slices shown (2 of 2)]
[im 1/45]
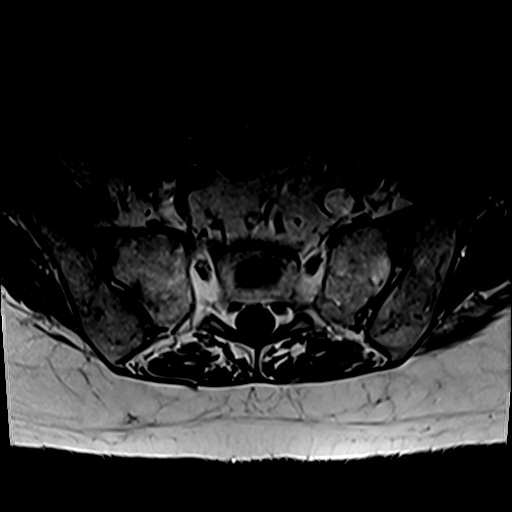
[im 9/45]
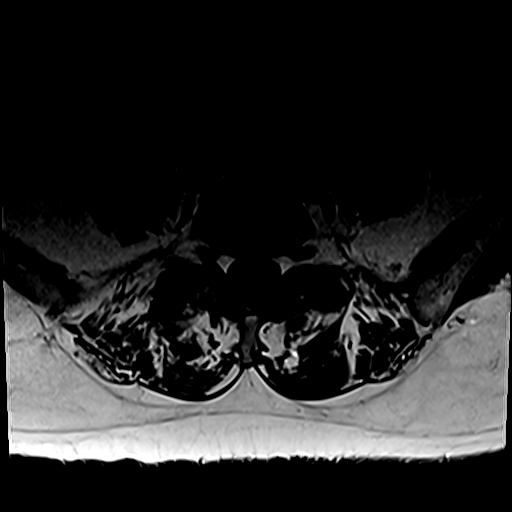
[im 18/45]
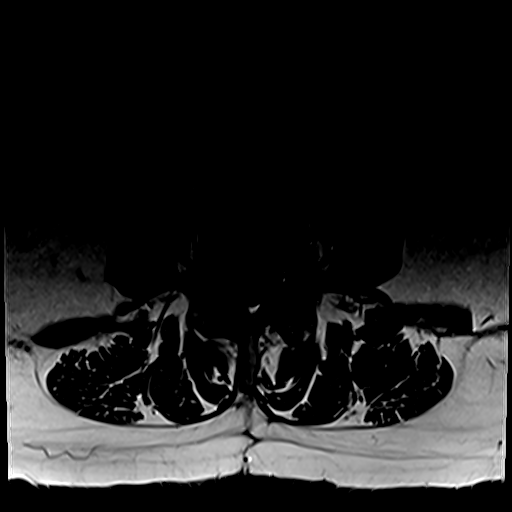
[im 27/45]
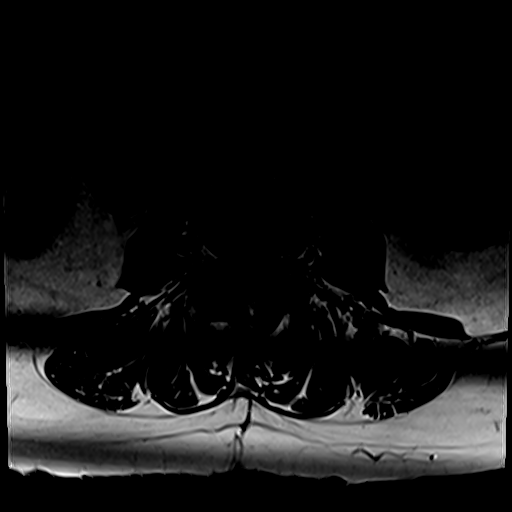
[im 36/45]
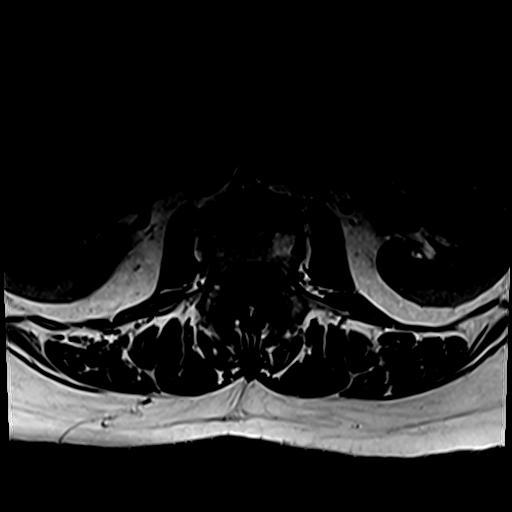
[im 45/45]
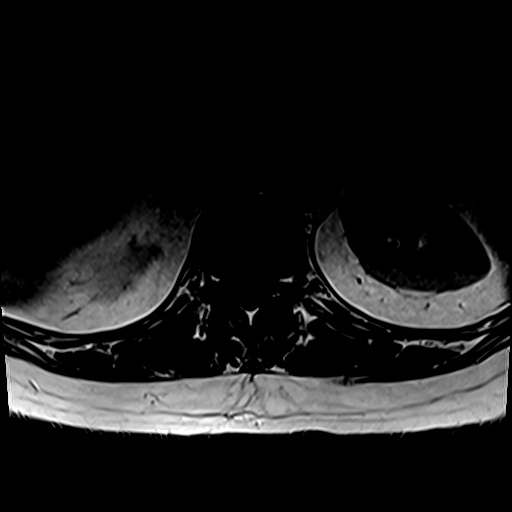

[Series 9: T2 post-contrast · sagittal · 4.0mm · 0.81mm/px · 2 of 15 slices shown]
[im 1/15]
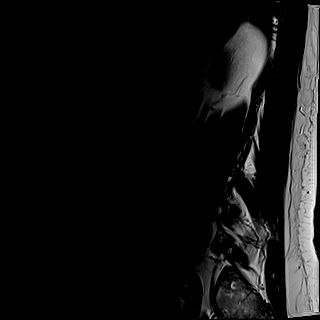
[im 15/15]
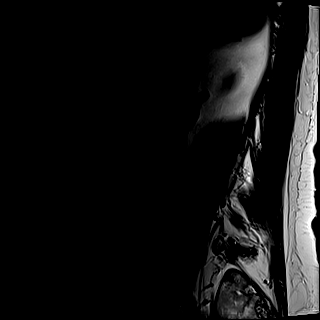

[Series 10: T1 fat-sat post-contrast · sagittal · 4.0mm · 0.81mm/px · 2 of 15 slices shown]
[im 1/15]
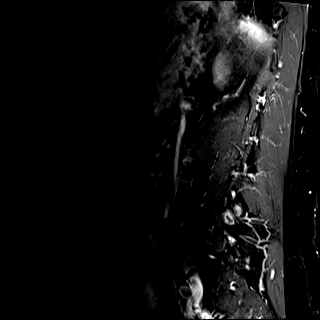
[im 15/15]
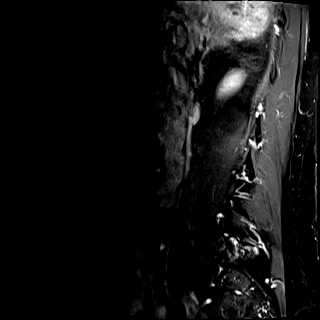

[Series 11: T1 post-contrast · axial · 4.0mm · 0.43mm/px · z∈[-133,+60]mm · 5 of 45 slices shown]
[im 1/45]
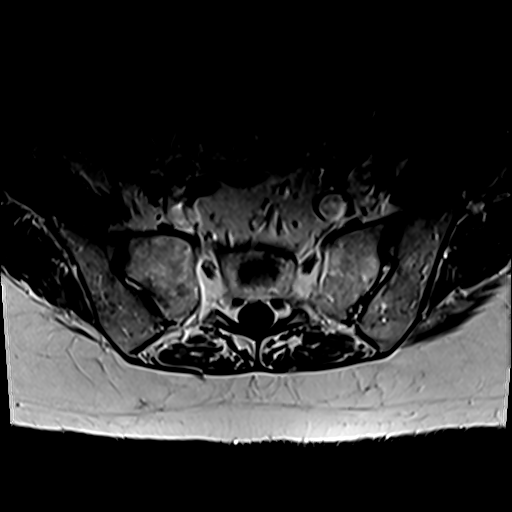
[im 9/45]
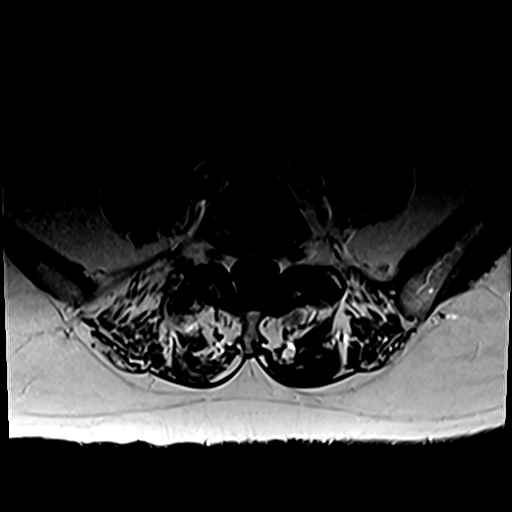
[im 18/45]
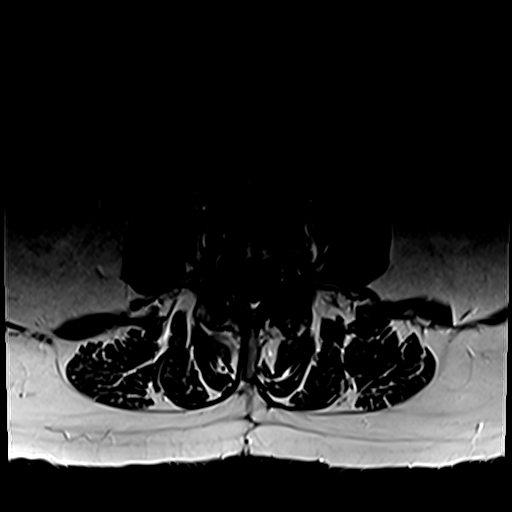
[im 27/45]
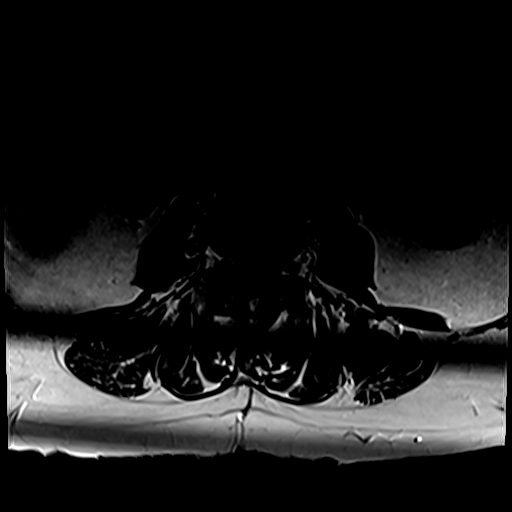
[im 36/45]
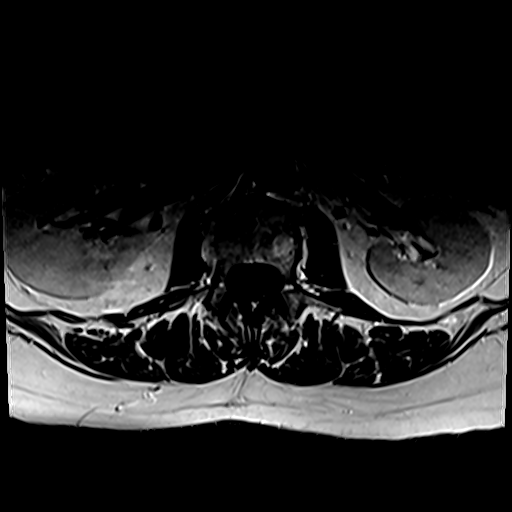

[25 of 48 positions shown; findings below may reference images not displayed]

FINDINGS: Segmentation: 5 non rib-bearing lumbar type vertebral bodies are
present. The lowest fully formed vertebral body is L5.

Alignment: Slight retrolisthesis at L2-3 is stable. No other
significant listhesis is present. Lumbar lordosis is preserved.

Vertebrae: Marrow signal and vertebral body heights are normal. No
pathologic enhancement is present.

Conus medullaris and cauda equina: Conus extends to the L1 level.
Conus and cauda equina appear normal.

Paraspinal and other soft tissues: Limited imaging the abdomen is
unremarkable. There is no significant adenopathy. No solid organ
lesions are present.

Disc levels:

L1-2: Negative.

L2-3: A rightward disc protrusion has progressed. Mild right
subarticular narrowing is now present. Mild right foraminal
narrowing is similar the prior exam.

L3-4: Right laminectomy noted. The central canal is patent. Mild
disc bulging and facet hypertrophy is present without significant
stenosis.

L4-5: Mild facet hypertrophy and ligamentum flavum thickening is
noted bilaterally similar the prior study. Slight disc bulging is
present without significant stenosis.

L5-S1: Asymmetric right-sided facet hypertrophy is again noted. No
significant disc protrusion or stenosis is present.
IMPRESSION: 1. Progressive rightward disc protrusion at L2-3 with mild right
subarticular and foraminal narrowing.
2. Right laminectomy at L3-4 without residual or recurrent stenosis.
The posterior central canal is decompressed.
3. Mild facet hypertrophy and ligamentum flavum thickening at L4-5
without significant stenosis.
4. Asymmetric right-sided facet hypertrophy at L5-S1 without
significant disc protrusion or stenosis.

## 2020-08-20 MED ORDER — GADOBUTROL 1 MMOL/ML IV SOLN
6.5000 mL | Freq: Once | INTRAVENOUS | Status: AC | PRN
  Administered 2020-08-20: 6.5 mL via INTRAVENOUS

## 2020-08-26 DIAGNOSIS — M5416 Radiculopathy, lumbar region: Secondary | ICD-10-CM | POA: Diagnosis not present

## 2020-10-28 DIAGNOSIS — Z23 Encounter for immunization: Secondary | ICD-10-CM | POA: Diagnosis not present

## 2020-11-05 DIAGNOSIS — M25552 Pain in left hip: Secondary | ICD-10-CM | POA: Diagnosis not present

## 2020-11-05 DIAGNOSIS — M25562 Pain in left knee: Secondary | ICD-10-CM | POA: Diagnosis not present

## 2021-01-12 DIAGNOSIS — M79661 Pain in right lower leg: Secondary | ICD-10-CM | POA: Diagnosis not present

## 2021-01-12 DIAGNOSIS — R269 Unspecified abnormalities of gait and mobility: Secondary | ICD-10-CM | POA: Diagnosis not present

## 2021-01-12 DIAGNOSIS — M25561 Pain in right knee: Secondary | ICD-10-CM | POA: Diagnosis not present

## 2021-01-12 DIAGNOSIS — M79604 Pain in right leg: Secondary | ICD-10-CM | POA: Diagnosis not present

## 2021-01-20 DIAGNOSIS — M79604 Pain in right leg: Secondary | ICD-10-CM | POA: Diagnosis not present

## 2021-01-20 DIAGNOSIS — R269 Unspecified abnormalities of gait and mobility: Secondary | ICD-10-CM | POA: Diagnosis not present

## 2021-01-20 DIAGNOSIS — M79661 Pain in right lower leg: Secondary | ICD-10-CM | POA: Diagnosis not present

## 2021-01-20 DIAGNOSIS — M25561 Pain in right knee: Secondary | ICD-10-CM | POA: Diagnosis not present

## 2021-01-26 DIAGNOSIS — M25561 Pain in right knee: Secondary | ICD-10-CM | POA: Diagnosis not present

## 2021-01-26 DIAGNOSIS — M79604 Pain in right leg: Secondary | ICD-10-CM | POA: Diagnosis not present

## 2021-01-26 DIAGNOSIS — R269 Unspecified abnormalities of gait and mobility: Secondary | ICD-10-CM | POA: Diagnosis not present

## 2021-01-26 DIAGNOSIS — M79661 Pain in right lower leg: Secondary | ICD-10-CM | POA: Diagnosis not present

## 2021-01-28 DIAGNOSIS — M79661 Pain in right lower leg: Secondary | ICD-10-CM | POA: Diagnosis not present

## 2021-01-28 DIAGNOSIS — M25561 Pain in right knee: Secondary | ICD-10-CM | POA: Diagnosis not present

## 2021-01-28 DIAGNOSIS — R269 Unspecified abnormalities of gait and mobility: Secondary | ICD-10-CM | POA: Diagnosis not present

## 2021-01-28 DIAGNOSIS — M79604 Pain in right leg: Secondary | ICD-10-CM | POA: Diagnosis not present

## 2021-02-02 DIAGNOSIS — M79661 Pain in right lower leg: Secondary | ICD-10-CM | POA: Diagnosis not present

## 2021-02-02 DIAGNOSIS — R269 Unspecified abnormalities of gait and mobility: Secondary | ICD-10-CM | POA: Diagnosis not present

## 2021-02-02 DIAGNOSIS — M79604 Pain in right leg: Secondary | ICD-10-CM | POA: Diagnosis not present

## 2021-02-02 DIAGNOSIS — M25561 Pain in right knee: Secondary | ICD-10-CM | POA: Diagnosis not present

## 2021-02-08 DIAGNOSIS — M79661 Pain in right lower leg: Secondary | ICD-10-CM | POA: Diagnosis not present

## 2021-02-08 DIAGNOSIS — R269 Unspecified abnormalities of gait and mobility: Secondary | ICD-10-CM | POA: Diagnosis not present

## 2021-02-08 DIAGNOSIS — M79604 Pain in right leg: Secondary | ICD-10-CM | POA: Diagnosis not present

## 2021-02-08 DIAGNOSIS — M25561 Pain in right knee: Secondary | ICD-10-CM | POA: Diagnosis not present

## 2021-02-10 DIAGNOSIS — M25561 Pain in right knee: Secondary | ICD-10-CM | POA: Diagnosis not present

## 2021-02-10 DIAGNOSIS — M79604 Pain in right leg: Secondary | ICD-10-CM | POA: Diagnosis not present

## 2021-02-10 DIAGNOSIS — R269 Unspecified abnormalities of gait and mobility: Secondary | ICD-10-CM | POA: Diagnosis not present

## 2021-02-10 DIAGNOSIS — M79661 Pain in right lower leg: Secondary | ICD-10-CM | POA: Diagnosis not present

## 2021-02-14 DIAGNOSIS — R269 Unspecified abnormalities of gait and mobility: Secondary | ICD-10-CM | POA: Diagnosis not present

## 2021-02-14 DIAGNOSIS — M79604 Pain in right leg: Secondary | ICD-10-CM | POA: Diagnosis not present

## 2021-02-14 DIAGNOSIS — M79661 Pain in right lower leg: Secondary | ICD-10-CM | POA: Diagnosis not present

## 2021-02-14 DIAGNOSIS — M25561 Pain in right knee: Secondary | ICD-10-CM | POA: Diagnosis not present

## 2021-02-16 DIAGNOSIS — M79604 Pain in right leg: Secondary | ICD-10-CM | POA: Diagnosis not present

## 2021-02-16 DIAGNOSIS — E7800 Pure hypercholesterolemia, unspecified: Secondary | ICD-10-CM | POA: Diagnosis not present

## 2021-02-16 DIAGNOSIS — Z0000 Encounter for general adult medical examination without abnormal findings: Secondary | ICD-10-CM | POA: Diagnosis not present

## 2021-02-16 DIAGNOSIS — Z23 Encounter for immunization: Secondary | ICD-10-CM | POA: Diagnosis not present

## 2021-02-16 DIAGNOSIS — F5101 Primary insomnia: Secondary | ICD-10-CM | POA: Diagnosis not present

## 2021-02-16 DIAGNOSIS — Z1389 Encounter for screening for other disorder: Secondary | ICD-10-CM | POA: Diagnosis not present

## 2021-02-16 DIAGNOSIS — Z79899 Other long term (current) drug therapy: Secondary | ICD-10-CM | POA: Diagnosis not present

## 2021-02-17 DIAGNOSIS — R269 Unspecified abnormalities of gait and mobility: Secondary | ICD-10-CM | POA: Diagnosis not present

## 2021-02-17 DIAGNOSIS — M25561 Pain in right knee: Secondary | ICD-10-CM | POA: Diagnosis not present

## 2021-02-17 DIAGNOSIS — M79661 Pain in right lower leg: Secondary | ICD-10-CM | POA: Diagnosis not present

## 2021-02-17 DIAGNOSIS — M79604 Pain in right leg: Secondary | ICD-10-CM | POA: Diagnosis not present

## 2021-02-23 DIAGNOSIS — M25561 Pain in right knee: Secondary | ICD-10-CM | POA: Diagnosis not present

## 2021-02-23 DIAGNOSIS — R269 Unspecified abnormalities of gait and mobility: Secondary | ICD-10-CM | POA: Diagnosis not present

## 2021-02-23 DIAGNOSIS — M79604 Pain in right leg: Secondary | ICD-10-CM | POA: Diagnosis not present

## 2021-02-23 DIAGNOSIS — M79661 Pain in right lower leg: Secondary | ICD-10-CM | POA: Diagnosis not present

## 2021-02-28 DIAGNOSIS — M25561 Pain in right knee: Secondary | ICD-10-CM | POA: Diagnosis not present

## 2021-02-28 DIAGNOSIS — R269 Unspecified abnormalities of gait and mobility: Secondary | ICD-10-CM | POA: Diagnosis not present

## 2021-02-28 DIAGNOSIS — M79661 Pain in right lower leg: Secondary | ICD-10-CM | POA: Diagnosis not present

## 2021-02-28 DIAGNOSIS — M79604 Pain in right leg: Secondary | ICD-10-CM | POA: Diagnosis not present

## 2021-04-21 DIAGNOSIS — H524 Presbyopia: Secondary | ICD-10-CM | POA: Diagnosis not present

## 2021-04-21 DIAGNOSIS — H2513 Age-related nuclear cataract, bilateral: Secondary | ICD-10-CM | POA: Diagnosis not present

## 2021-04-21 DIAGNOSIS — H5213 Myopia, bilateral: Secondary | ICD-10-CM | POA: Diagnosis not present

## 2021-04-21 DIAGNOSIS — H52223 Regular astigmatism, bilateral: Secondary | ICD-10-CM | POA: Diagnosis not present

## 2021-05-17 ENCOUNTER — Other Ambulatory Visit: Payer: Self-pay | Admitting: Geriatric Medicine

## 2021-05-17 DIAGNOSIS — Z1231 Encounter for screening mammogram for malignant neoplasm of breast: Secondary | ICD-10-CM

## 2021-06-01 ENCOUNTER — Other Ambulatory Visit: Payer: Self-pay

## 2021-06-01 ENCOUNTER — Ambulatory Visit
Admission: RE | Admit: 2021-06-01 | Discharge: 2021-06-01 | Disposition: A | Discharge status: Home / Self Care | Payer: Medicare Other | Source: Ambulatory Visit | Attending: Geriatric Medicine | Admitting: Geriatric Medicine

## 2021-06-01 DIAGNOSIS — Z1231 Encounter for screening mammogram for malignant neoplasm of breast: Secondary | ICD-10-CM | POA: Diagnosis not present

## 2021-06-01 IMAGING — MG MM DIGITAL SCREENING BILAT W/ TOMO AND CAD
8 series · 9 of 24 positions shown · non-contrast
Comparison: Previous exam(s).

CLINICAL DATA: Screening.

EXAM:
DIGITAL SCREENING BILATERAL MAMMOGRAM WITH TOMOSYNTHESIS AND CAD
TECHNIQUE: Bilateral screening digital craniocaudal and mediolateral oblique
mammograms were obtained. Bilateral screening digital breast
tomosynthesis was performed. The images were evaluated with
computer-aided detection.

[L CC synth-2D]
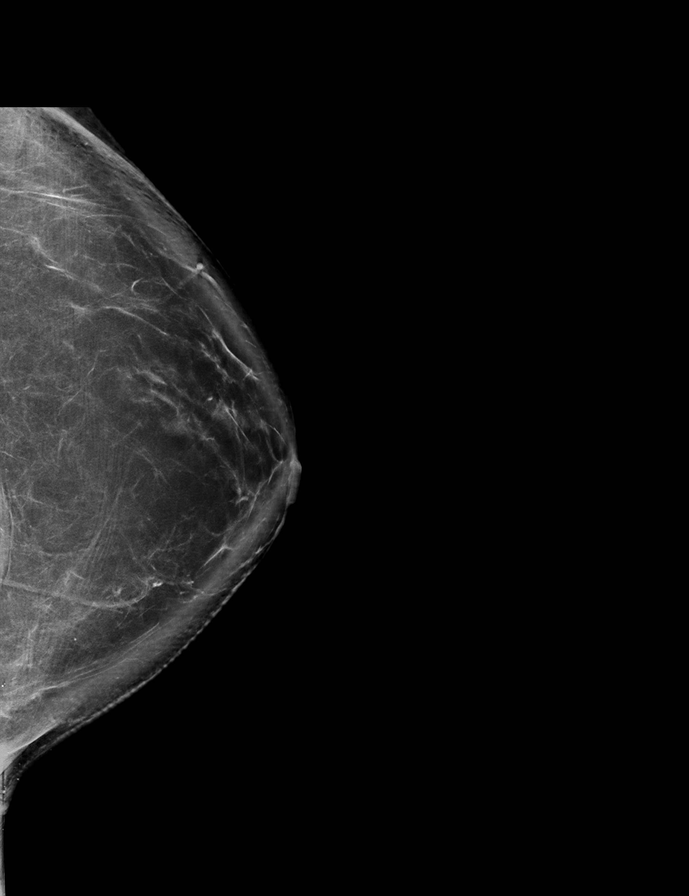

[R CC synth-2D]
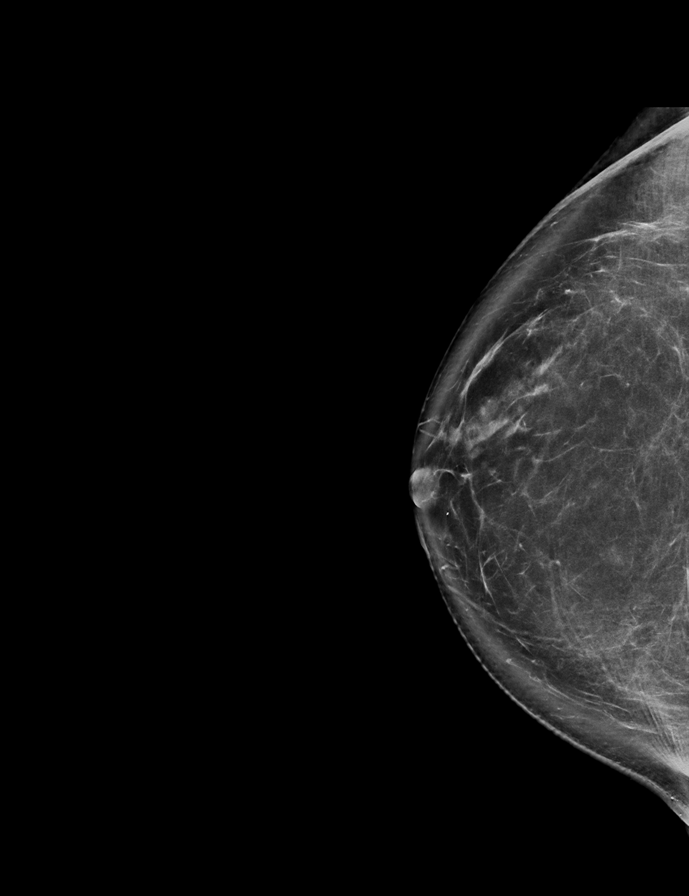

[R MLO synth-2D]
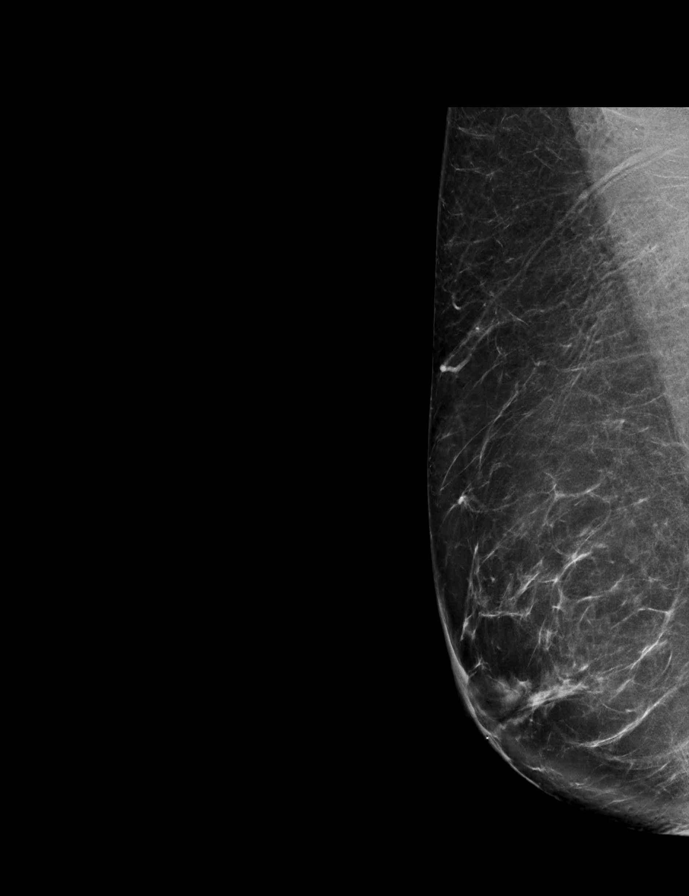

[L MLO synth-2D]
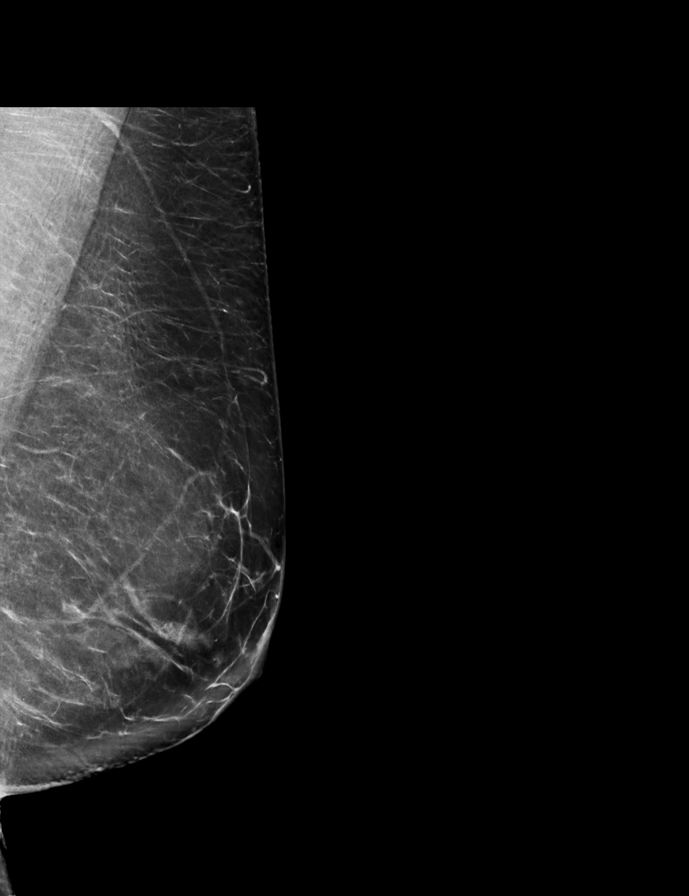

[L CC tomo · 2 of 98 frames shown]
[frame 32/98]
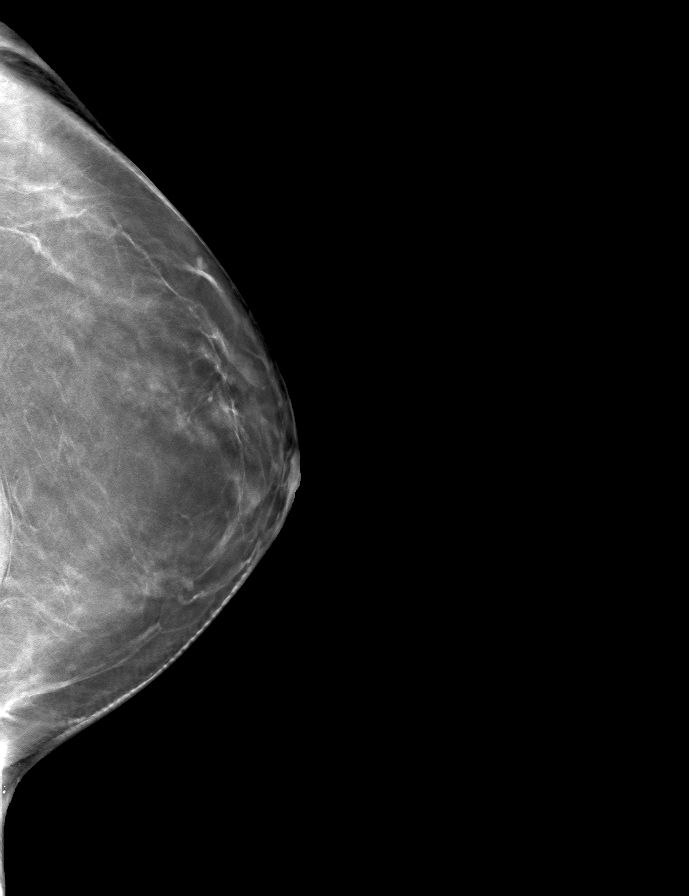
[frame 49/98]
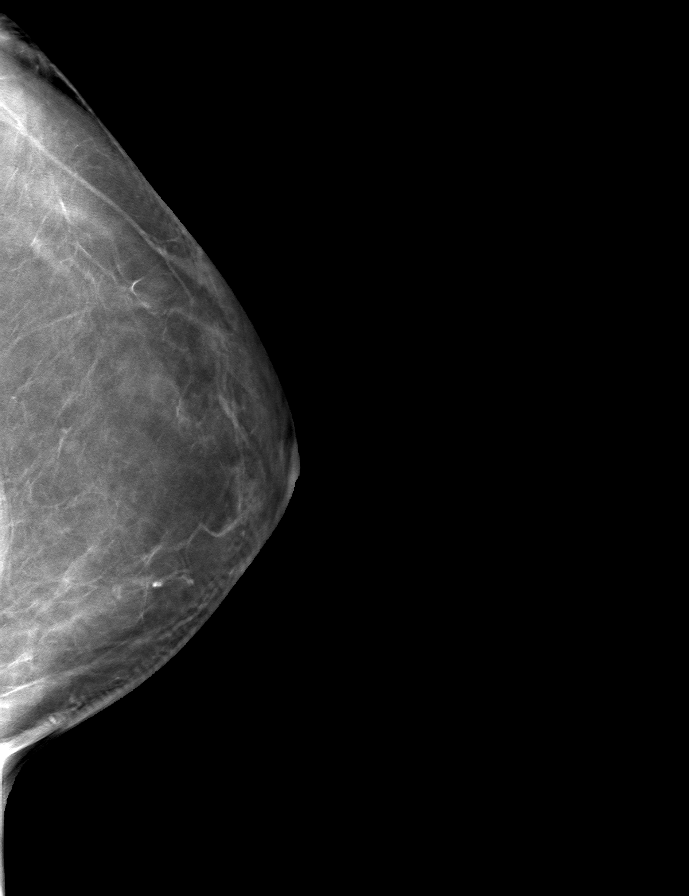

[R MLO tomo · tomo slice 41/80.0]
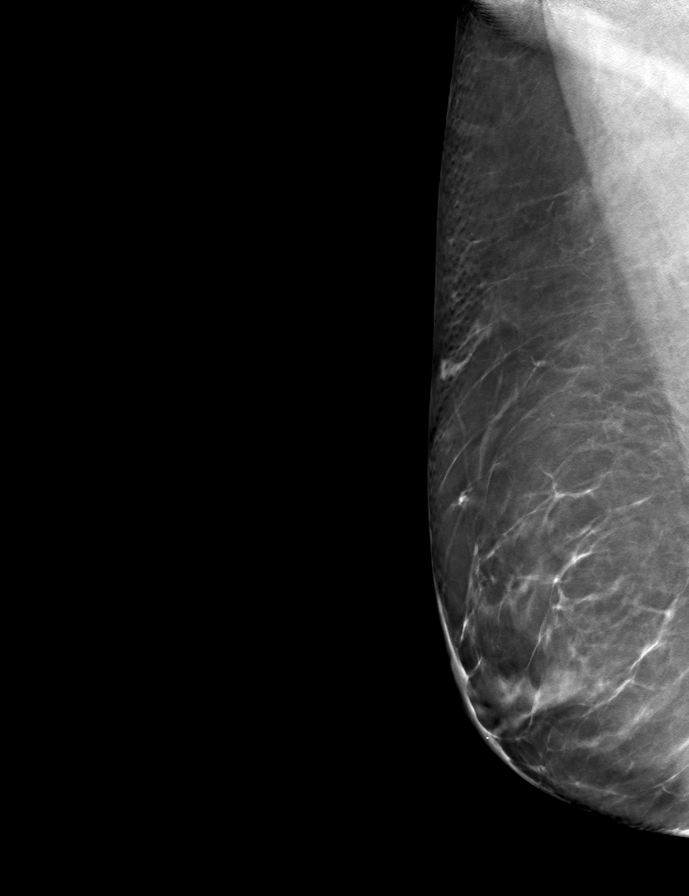

[L MLO tomo · tomo slice 41/82.0]
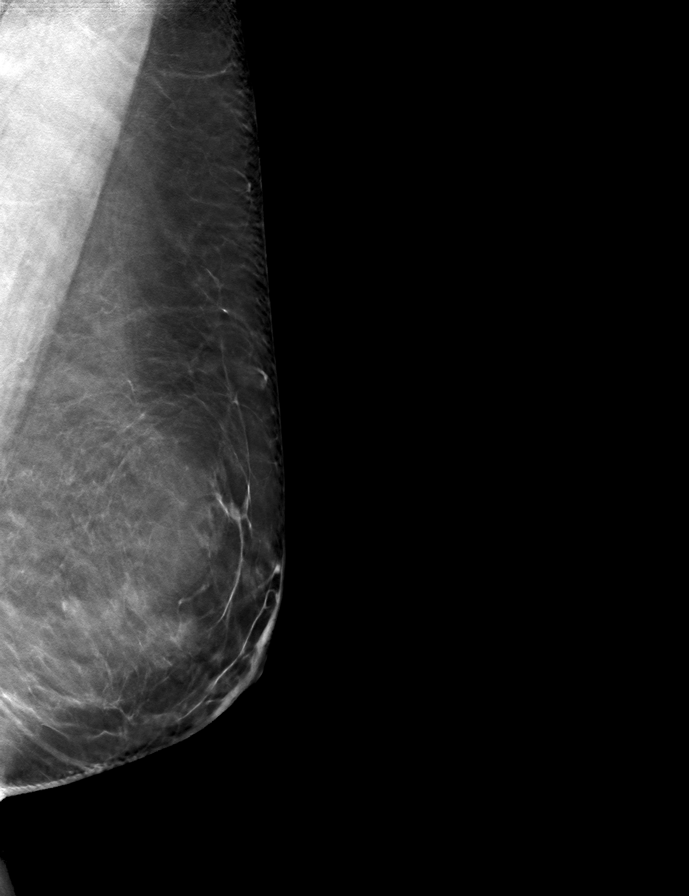

[R CC tomo · tomo slice 47/93.0]
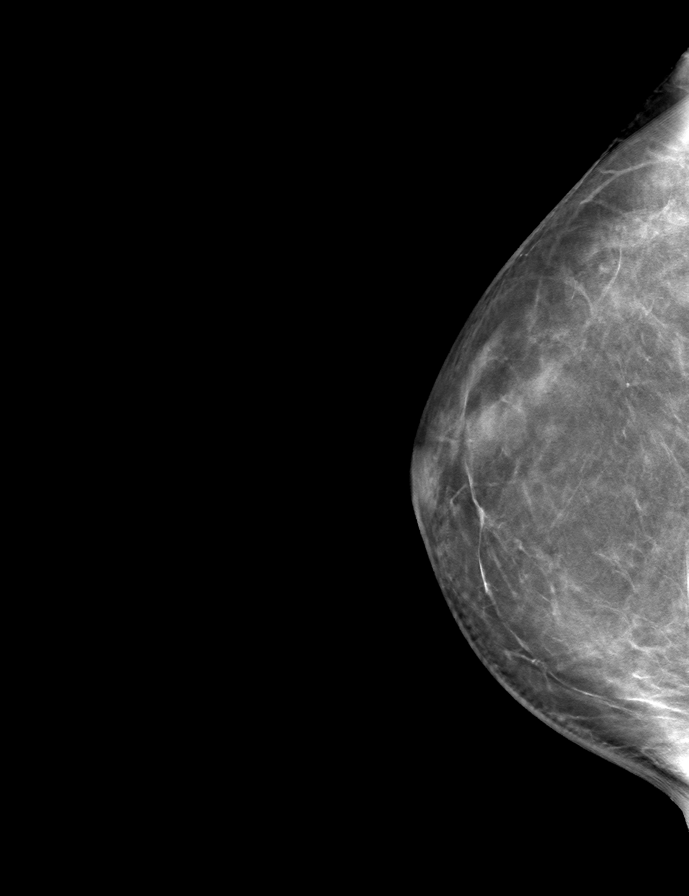

[9 of 24 positions shown; findings below may reference images not displayed]

ACR Breast Density Category b: There are scattered areas of
fibroglandular density.
FINDINGS: There are no findings suspicious for malignancy.
IMPRESSION: No mammographic evidence of malignancy. A result letter of this
screening mammogram will be mailed directly to the patient.

RECOMMENDATION:
Screening mammogram in one year. (Code:[BY])

BI-RADS CATEGORY  1: Negative.

## 2021-06-13 DIAGNOSIS — D485 Neoplasm of uncertain behavior of skin: Secondary | ICD-10-CM | POA: Diagnosis not present

## 2021-06-13 DIAGNOSIS — L570 Actinic keratosis: Secondary | ICD-10-CM | POA: Diagnosis not present

## 2021-06-13 DIAGNOSIS — Z85828 Personal history of other malignant neoplasm of skin: Secondary | ICD-10-CM | POA: Diagnosis not present

## 2021-06-13 DIAGNOSIS — L821 Other seborrheic keratosis: Secondary | ICD-10-CM | POA: Diagnosis not present

## 2021-06-13 DIAGNOSIS — D225 Melanocytic nevi of trunk: Secondary | ICD-10-CM | POA: Diagnosis not present

## 2021-06-29 DIAGNOSIS — M25562 Pain in left knee: Secondary | ICD-10-CM | POA: Diagnosis not present

## 2021-06-29 DIAGNOSIS — M79662 Pain in left lower leg: Secondary | ICD-10-CM | POA: Diagnosis not present

## 2021-06-29 DIAGNOSIS — M79661 Pain in right lower leg: Secondary | ICD-10-CM | POA: Diagnosis not present

## 2021-07-12 ENCOUNTER — Other Ambulatory Visit: Payer: Self-pay | Admitting: *Deleted

## 2021-07-12 DIAGNOSIS — I739 Peripheral vascular disease, unspecified: Secondary | ICD-10-CM

## 2021-07-19 DIAGNOSIS — M5451 Vertebrogenic low back pain: Secondary | ICD-10-CM | POA: Diagnosis not present

## 2021-07-19 DIAGNOSIS — M79661 Pain in right lower leg: Secondary | ICD-10-CM | POA: Diagnosis not present

## 2021-07-19 DIAGNOSIS — M5459 Other low back pain: Secondary | ICD-10-CM | POA: Diagnosis not present

## 2021-07-19 DIAGNOSIS — M79662 Pain in left lower leg: Secondary | ICD-10-CM | POA: Diagnosis not present

## 2021-07-20 ENCOUNTER — Other Ambulatory Visit: Payer: Self-pay | Admitting: Sports Medicine

## 2021-07-20 DIAGNOSIS — M545 Low back pain, unspecified: Secondary | ICD-10-CM

## 2021-07-25 ENCOUNTER — Ambulatory Visit (INDEPENDENT_AMBULATORY_CARE_PROVIDER_SITE_OTHER): Payer: Medicare Other | Admitting: Surgery

## 2021-07-25 ENCOUNTER — Ambulatory Visit
Admission: RE | Admit: 2021-07-25 | Discharge: 2021-07-25 | Disposition: A | Discharge status: Home / Self Care | Payer: Medicare Other | Source: Ambulatory Visit | Attending: Sports Medicine | Admitting: Sports Medicine

## 2021-07-25 ENCOUNTER — Encounter: Payer: Self-pay | Admitting: Surgery

## 2021-07-25 ENCOUNTER — Ambulatory Visit (HOSPITAL_COMMUNITY)
Admission: RE | Admit: 2021-07-25 | Discharge: 2021-07-25 | Disposition: A | Discharge status: Home / Self Care | Payer: Medicare Other | Source: Ambulatory Visit | Attending: Surgery | Admitting: Surgery

## 2021-07-25 VITALS — BP 136/83 | HR 67 | Temp 98.3°F | Resp 20 | Ht 66.0 in

## 2021-07-25 DIAGNOSIS — M25562 Pain in left knee: Secondary | ICD-10-CM

## 2021-07-25 DIAGNOSIS — M545 Low back pain, unspecified: Secondary | ICD-10-CM

## 2021-07-25 DIAGNOSIS — M479 Spondylosis, unspecified: Secondary | ICD-10-CM

## 2021-07-25 DIAGNOSIS — M5136 Other intervertebral disc degeneration, lumbar region: Secondary | ICD-10-CM | POA: Diagnosis not present

## 2021-07-25 DIAGNOSIS — M25561 Pain in right knee: Secondary | ICD-10-CM

## 2021-07-25 DIAGNOSIS — M48061 Spinal stenosis, lumbar region without neurogenic claudication: Secondary | ICD-10-CM | POA: Diagnosis not present

## 2021-07-25 DIAGNOSIS — I739 Peripheral vascular disease, unspecified: Secondary | ICD-10-CM | POA: Diagnosis not present

## 2021-07-25 IMAGING — MR MR LUMBAR SPINE W/O CM
4 of 5 series · 24 of 48 positions shown · non-contrast
Comparison: Lumbar spine MRI [DATE]

CLINICAL DATA: Bilateral leg pain, lower back pain

EXAM:
MRI LUMBAR SPINE WITHOUT CONTRAST
TECHNIQUE: Multiplanar, multisequence MR imaging of the lumbar spine was
performed. No intravenous contrast was administered.

[Series 2: T2 · sagittal · 4.0mm · 0.53mm/px · 6 of 15 slices shown (1 of 2)]
[im 1/15]
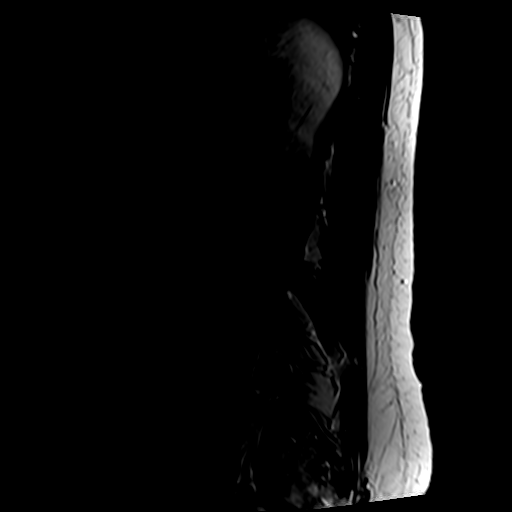
[im 3/15]
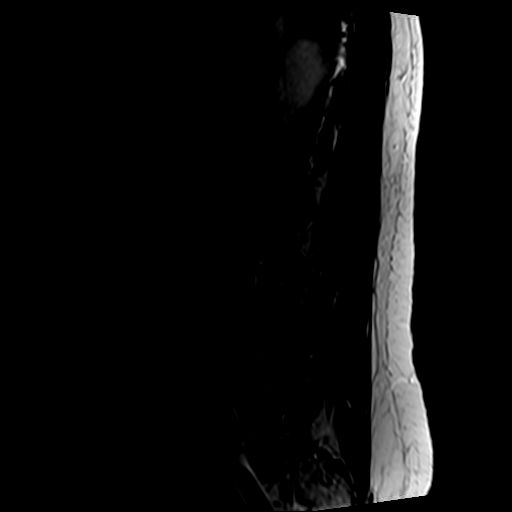
[im 6/15]
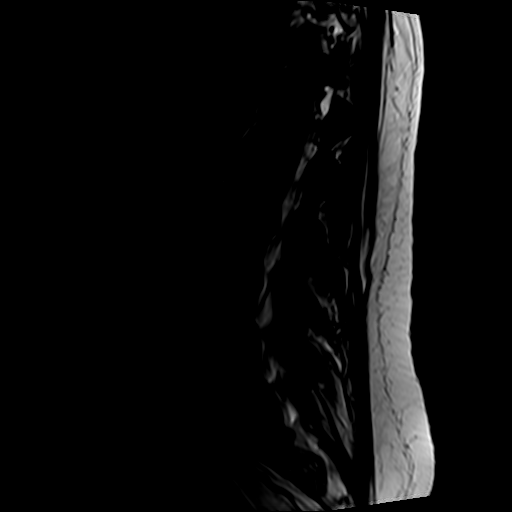
[im 9/15]
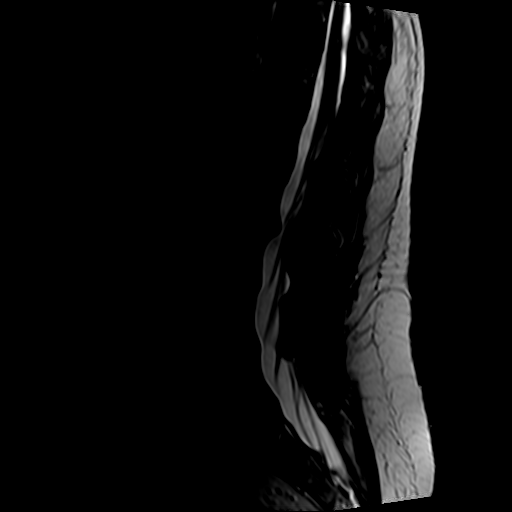
[im 12/15]
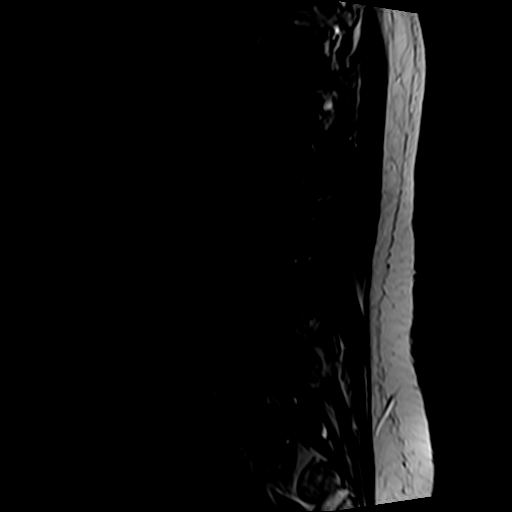
[im 15/15]
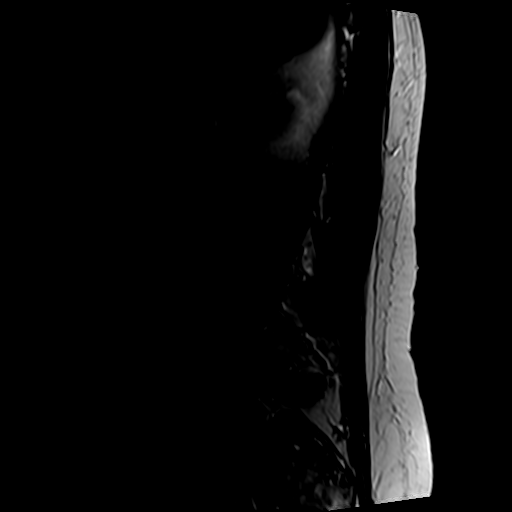

[Series 4: T1 · sagittal · 4.0mm · 0.53mm/px · 6 of 15 slices shown (1 of 2)]
[im 1/15]
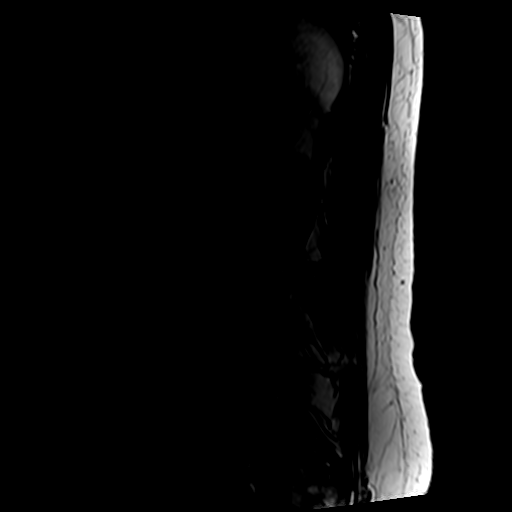
[im 3/15]
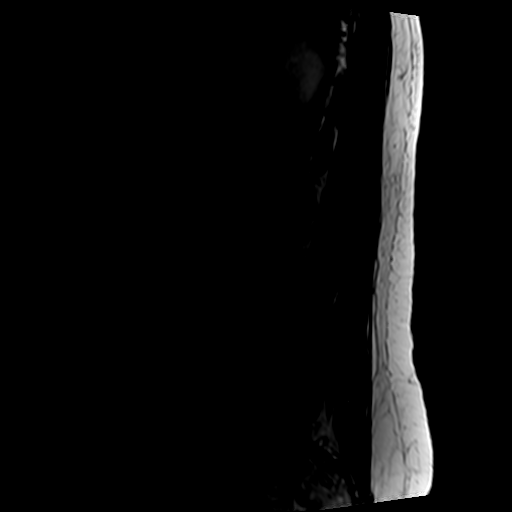
[im 6/15]
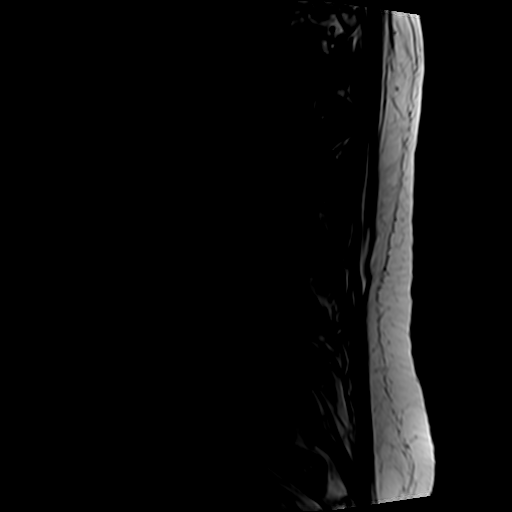
[im 9/15]
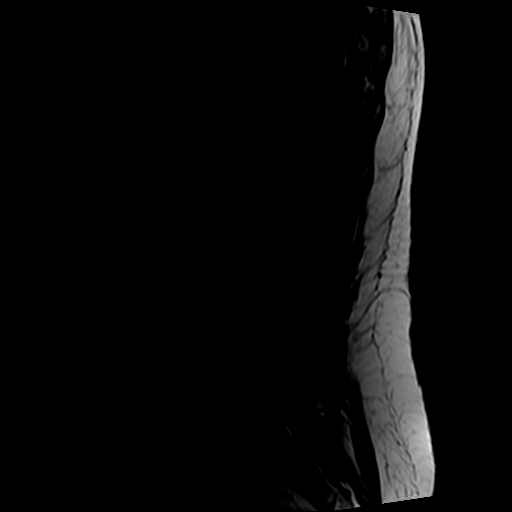
[im 12/15]
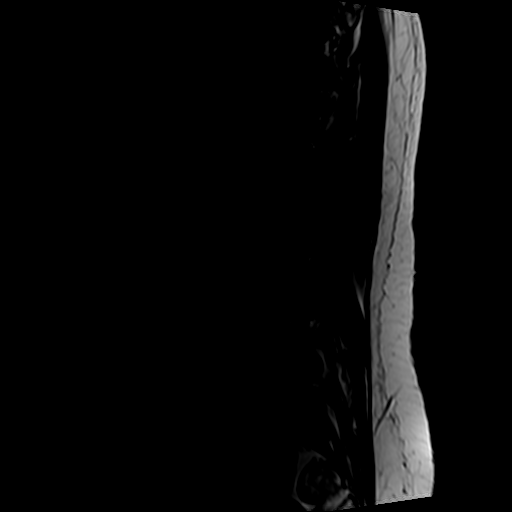
[im 15/15]
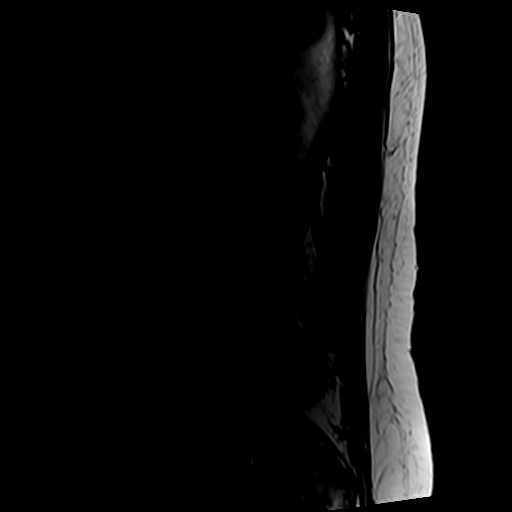

[Series 5: T2 · axial · 4.0mm · 0.70mm/px · z∈[-66,+133]mm · 9 of 36 slices shown (2 of 2)]
[im 1/36]
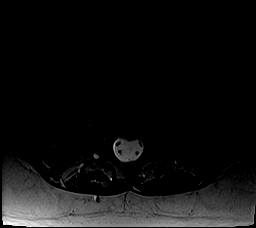
[im 6/36]
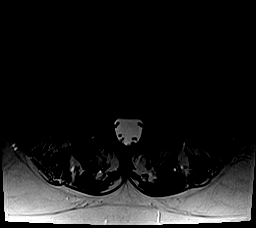
[im 11/36]
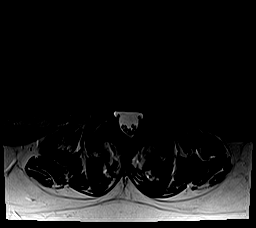
[im 16/36]
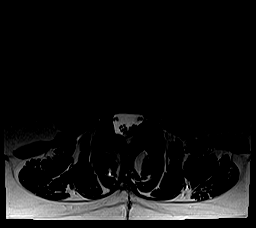
[im 18/36]
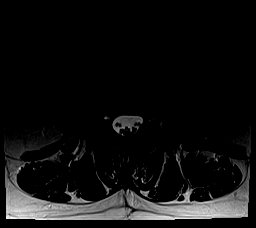
[im 21/36]
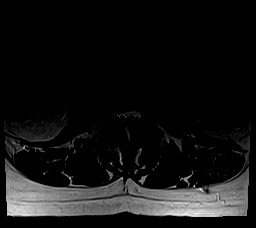
[im 26/36]
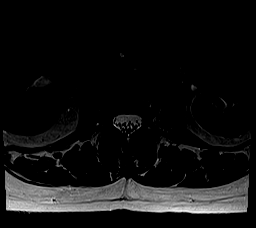
[im 31/36]
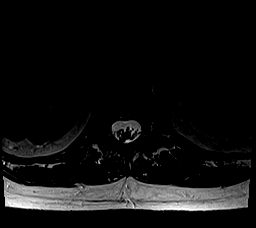
[im 36/36]
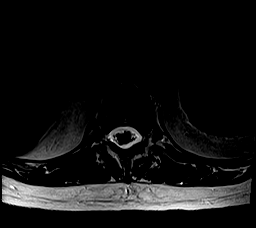

[Series 6: T1 · axial · 4.0mm · 0.35mm/px · z∈[-40,+107]mm · 3 of 36 slices shown (2 of 2)]
[im 6/36]
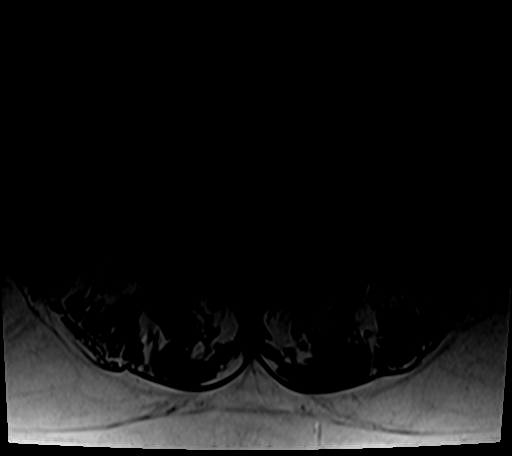
[im 18/36]
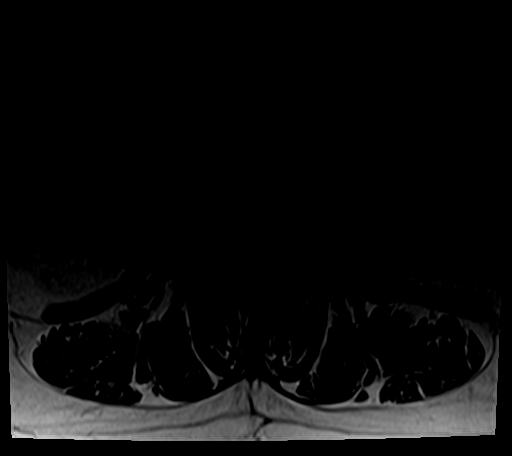
[im 31/36]
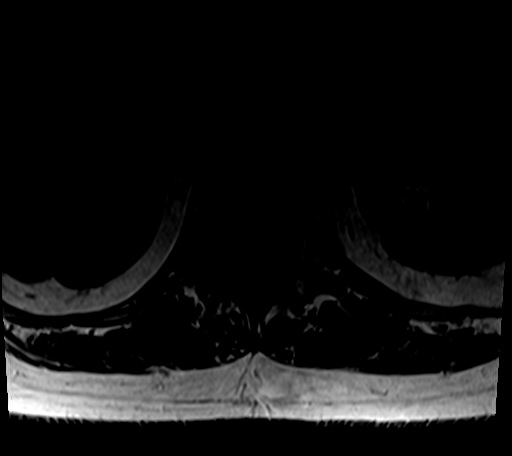

[24 of 48 positions shown; findings below may reference images not displayed]

FINDINGS: Segmentation:  Standard.

Alignment:  Trace degenerative retrolisthesis at L2-L3.

Vertebrae: No fracture, evidence of discitis, or aggressive bone
lesion.

Conus medullaris and cauda equina: Conus extends to the L1 level.
Conus and cauda equina appear normal.

Paraspinal and other soft tissues: Mild lower paraspinal muscle
atrophy. Right extrarenal pelvis, unchanged from prior. Tiny right
lower pole renal cyst.

Disc levels:

T11-T12: Focal left paracentral ligamentum flavum thickening. No
significant disc bulging. No significant stenosis.

T12-L1: There is moderate bilateral facet arthropathy and ligament
flavum hypertrophy. No significant spinal canal or neural foraminal
stenosis. There are unchanged bilateral perineural cysts of the
exiting nerve roots.

L1-L2: There is moderate bilateral facet arthropathy and ligament
flavum hypertrophy. No significant disc bulging. There is mild
spinal canal narrowing. No significant neural foraminal stenosis.

L2-L3: Asymmetric right disc bulging results in moderate right
subarticular stenosis and contact with the descending right L3 nerve
root (series 5, image 16), slightly progressed from prior. There is
left greater than right facet arthropathy. Overall mild to moderate
spinal canal stenosis. There is moderate right-sided neural
foraminal stenosis and contact with the right exiting nerve root.

L3-L4: Mild disc bulging with left greater than right ligamentum
flavum thickening and signs of right sided hemilaminotomy. Left
greater than right facet arthropathy. There is mild bilateral neural
foraminal stenosis. No spinal canal stenosis.

L4-L5: Trace degenerative anterolisthesis with minimal disc bulging,
ligamentum hypertrophy and left greater than right facet
arthropathy. No significant spinal canal stenosis. There is mild
left-sided neural foraminal narrowing. Impingement.

L5-S1: Minimal disc bulging. Moderate bilateral facet arthropathy
with right-sided facet effusion. No significant spinal canal or
neural foraminal stenosis.
IMPRESSION: Multilevel degenerative changes of the lumbar spine, with slight
progression at L2-L3 in comparison to prior, as summarized below:

L1-L2: Moderate bilateral facet arthropathy. Mild spinal canal
narrowing. No significant neural foraminal stenosis. No impingement.

L2-L3: Asymmetric right disc bulging with right-sided subarticular
stenosis and contact with the descending right L3 nerve root,
slightly progressed from prior exam. Mild to moderate spinal canal
stenosis. Moderate right-sided neural foraminal stenosis and contact
with the right exiting L2 nerve root.

L3-L4: Mild bilateral neural foraminal stenosis predominantly due to
facet arthropathy.

L4-L5: Mild left-sided neural foraminal stenosis predominantly due
to facet arthropathy. No spinal canal stenosis.

L5-S1: Moderate bilateral facet arthropathy. No significant
stenosis.

## 2021-07-25 NOTE — Progress Notes (Signed)
? ?[facility] ? ?Patient name: Nichole Simpson MRN: [id number] DOB: 06-Jan-1951 Sex: female ? ? ?REQUESTING PROVIDER:  ? ? Dr. Delilah Shan ? ? ?REASON FOR CONSULT:  ?  ?PAD ? ?HISTORY OF PRESENT ILLNESS:  ? ?Nichole Simpson is a 71 y.o. female, who is referred for vascular evaluation.  She has been having bilateral leg pain which she describes as a shooting pain going down the back of her leg.  She went for MRI today.  She is here to rule out vascular etiology.  Her only other past medical history is that of arthritis.  She is a former smoker having quit many many years ago.  She denies claudication symptoms.  She denies ulceration. ? ?PAST MEDICAL HISTORY  ? ? ?Past Medical History:  ?Diagnosis Date  ? Arthritis   ? ? ? ?FAMILY HISTORY  ? ?Family History  ?Problem Relation Age of Onset  ? Colon cancer Neg Hx   ? Rectal cancer Neg Hx   ? Stomach cancer Neg Hx   ? ? ?SOCIAL HISTORY:  ? ?Social History  ? ?Socioeconomic History  ? Marital status: Married  ?  Spouse name: Not on file  ? Number of children: Not on file  ? Years of education: Not on file  ? Highest education level: Not on file  ?Occupational History  ? Not on file  ?Tobacco Use  ? Smoking status: Former  ?  Types: Cigarettes  ?  Quit date: 1979  ?  Years since quitting: 44.3  ?  Passive exposure: Never  ? Smokeless tobacco: Never  ?Vaping Use  ? Vaping Use: Never used  ?Substance and Sexual Activity  ? Alcohol use: Yes  ?  Alcohol/week: 5.0 standard drinks  ?  Types: 5 Shots of liquor per week  ? Drug use: Never  ? Sexual activity: Not on file  ?Other Topics Concern  ? Not on file  ?Social History Narrative  ? Not on file  ? ?Social Determinants of Health  ? ?Financial Resource Strain: Not on file  ?Food Insecurity: Not on file  ?Transportation Needs: Not on file  ?Physical Activity: Not on file  ?Stress: Not on file  ?Social Connections: Not on file  ?Intimate Partner Violence: Not on  file  ? ? ?ALLERGIES:  ? ? ?No Known Allergies ? ?CURRENT MEDICATIONS:  ? ? ?Current Outpatient Medications  ?Medication Sig Dispense Refill  ? ibuprofen (ADVIL) 400 MG tablet Take 400 mg by mouth every 6 (six) hours as needed.    ? mirabegron ER (MYRBETRIQ) 25 MG TB24 tablet Myrbetriq 25 mg tablet,extended release    ? OVER THE COUNTER MEDICATION Take 2 capsules by mouth in the morning and at bedtime.    ? TURMERIC CURCUMIN PO Take 2 capsules by mouth daily.    ? zolpidem (AMBIEN) 10 MG tablet zolpidem 10 mg tablet ? TAKE 1 TABLET BY MOUTH AT BEDTIME AS NEEDED    ? ?No current facility-administered medications for this visit.  ? ? ?REVIEW OF SYSTEMS:  ? ?'[X]'$  denotes positive finding, '[ ]'$  denotes negative finding ?Cardiac  Comments:  ?Chest pain or chest pressure:    ?Shortness of breath upon exertion:    ?Short of breath when lying flat:    ?Irregular heart rhythm:    ?    ?Vascular    ?Pain in calf, thigh, or hip brought on by ambulation:    ?Pain in feet at night that wakes you up from  your sleep:     ?Blood clot in your veins:    ?Leg swelling:     ?    ?Pulmonary    ?Oxygen at home:    ?Productive cough:     ?Wheezing:     ?    ?Neurologic    ?Sudden weakness in arms or legs:     ?Sudden numbness in arms or legs:     ?Sudden onset of difficulty speaking or slurred speech:    ?Temporary loss of vision in one eye:     ?Problems with dizziness:     ?    ?Gastrointestinal    ?Blood in stool:     ? ?Vomited blood:     ?    ?Genitourinary    ?Burning when urinating:     ?Blood in urine:    ?    ?Psychiatric    ?Major depression:     ?    ?Hematologic    ?Bleeding problems:    ?Problems with blood clotting too easily:    ?    ?Skin    ?Rashes or ulcers:    ?    ?Constitutional    ?Fever or chills:    ? ?PHYSICAL EXAM:  ? ?Vitals:  ? 07/25/21 1441  ?BP: 136/83  ?Pulse: 67  ?Resp: 20  ?Temp: 98.3 ?F (36.8 ?C)  ?TempSrc: Temporal  ?SpO2: 98%  ?Height: '5\' 6"'$  (1.676 m)  ? ? ?GENERAL: The patient is a well-nourished female,  in no acute distress. The vital signs are documented above. ?CARDIAC: There is a regular rate and rhythm.  ?VASCULAR: Palpable dorsalis pedis pulse bilaterally ?PULMONARY: Nonlabored respirations ?MUSCULOSKELETAL: There are no major deformities or cyanosis. ?NEUROLOGIC: No focal weakness or paresthesias are detected. ?SKIN: There are no ulcers or rashes noted. ?PSYCHIATRIC: The patient has a normal affect. ? ?STUDIES:  ? ?I have reviewed the following: ?+-------+-----------+-----------+------------+------------+  ?ABI/TBIToday's ABIToday's TBIPrevious ABIPrevious TBI  ?+-------+-----------+-----------+------------+------------+  ?Right  1.18       0.98                                 ?+-------+-----------+-----------+------------+------------+  ?Left   1.22       0.83                                 ?+-------+-----------+-----------+------------+------------+  ? ?Right toe pressure= 154 ?Left toe pressure= 130 ?All waveforms are triphasic ? ?ASSESSMENT and PLAN  ? ?Leg pain: The patient has palpable pedal pulses and a normal vascular exam with triphasic waveforms and normal toe pressures.  She does not have any evidence of vascular insufficiency and so I would pursue other causes for her leg pain. ? ? ?Annamarie Major, IV, MD, FACS ?[facility] ?Tel [phone] ?Pager [phone]  ?

## 2021-07-27 ENCOUNTER — Other Ambulatory Visit: Payer: Medicare Other

## 2021-08-09 DIAGNOSIS — M5416 Radiculopathy, lumbar region: Secondary | ICD-10-CM | POA: Diagnosis not present

## 2021-08-17 DIAGNOSIS — M7089 Other soft tissue disorders related to use, overuse and pressure multiple sites: Secondary | ICD-10-CM | POA: Diagnosis not present

## 2021-08-17 DIAGNOSIS — R262 Difficulty in walking, not elsewhere classified: Secondary | ICD-10-CM | POA: Diagnosis not present

## 2021-08-17 DIAGNOSIS — M2569 Stiffness of other specified joint, not elsewhere classified: Secondary | ICD-10-CM | POA: Diagnosis not present

## 2021-08-17 DIAGNOSIS — M2559 Pain in other specified joint: Secondary | ICD-10-CM | POA: Diagnosis not present

## 2021-08-24 DIAGNOSIS — M2569 Stiffness of other specified joint, not elsewhere classified: Secondary | ICD-10-CM | POA: Diagnosis not present

## 2021-08-24 DIAGNOSIS — R262 Difficulty in walking, not elsewhere classified: Secondary | ICD-10-CM | POA: Diagnosis not present

## 2021-08-24 DIAGNOSIS — M7089 Other soft tissue disorders related to use, overuse and pressure multiple sites: Secondary | ICD-10-CM | POA: Diagnosis not present

## 2021-08-24 DIAGNOSIS — M2559 Pain in other specified joint: Secondary | ICD-10-CM | POA: Diagnosis not present

## 2021-08-25 DIAGNOSIS — M2569 Stiffness of other specified joint, not elsewhere classified: Secondary | ICD-10-CM | POA: Diagnosis not present

## 2021-08-25 DIAGNOSIS — R262 Difficulty in walking, not elsewhere classified: Secondary | ICD-10-CM | POA: Diagnosis not present

## 2021-08-25 DIAGNOSIS — M2559 Pain in other specified joint: Secondary | ICD-10-CM | POA: Diagnosis not present

## 2021-08-25 DIAGNOSIS — M7089 Other soft tissue disorders related to use, overuse and pressure multiple sites: Secondary | ICD-10-CM | POA: Diagnosis not present

## 2021-08-29 DIAGNOSIS — M2559 Pain in other specified joint: Secondary | ICD-10-CM | POA: Diagnosis not present

## 2021-08-29 DIAGNOSIS — R262 Difficulty in walking, not elsewhere classified: Secondary | ICD-10-CM | POA: Diagnosis not present

## 2021-08-29 DIAGNOSIS — M7089 Other soft tissue disorders related to use, overuse and pressure multiple sites: Secondary | ICD-10-CM | POA: Diagnosis not present

## 2021-08-29 DIAGNOSIS — M2569 Stiffness of other specified joint, not elsewhere classified: Secondary | ICD-10-CM | POA: Diagnosis not present

## 2021-09-04 DIAGNOSIS — H6122 Impacted cerumen, left ear: Secondary | ICD-10-CM | POA: Diagnosis not present

## 2021-09-04 DIAGNOSIS — H9202 Otalgia, left ear: Secondary | ICD-10-CM | POA: Diagnosis not present

## 2021-09-06 DIAGNOSIS — M2569 Stiffness of other specified joint, not elsewhere classified: Secondary | ICD-10-CM | POA: Diagnosis not present

## 2021-09-06 DIAGNOSIS — R262 Difficulty in walking, not elsewhere classified: Secondary | ICD-10-CM | POA: Diagnosis not present

## 2021-09-06 DIAGNOSIS — M7089 Other soft tissue disorders related to use, overuse and pressure multiple sites: Secondary | ICD-10-CM | POA: Diagnosis not present

## 2021-09-06 DIAGNOSIS — M2559 Pain in other specified joint: Secondary | ICD-10-CM | POA: Diagnosis not present

## 2021-09-09 DIAGNOSIS — M7089 Other soft tissue disorders related to use, overuse and pressure multiple sites: Secondary | ICD-10-CM | POA: Diagnosis not present

## 2021-09-09 DIAGNOSIS — R262 Difficulty in walking, not elsewhere classified: Secondary | ICD-10-CM | POA: Diagnosis not present

## 2021-09-09 DIAGNOSIS — M2559 Pain in other specified joint: Secondary | ICD-10-CM | POA: Diagnosis not present

## 2021-09-09 DIAGNOSIS — M2569 Stiffness of other specified joint, not elsewhere classified: Secondary | ICD-10-CM | POA: Diagnosis not present

## 2021-09-22 DIAGNOSIS — M7089 Other soft tissue disorders related to use, overuse and pressure multiple sites: Secondary | ICD-10-CM | POA: Diagnosis not present

## 2021-09-22 DIAGNOSIS — M2559 Pain in other specified joint: Secondary | ICD-10-CM | POA: Diagnosis not present

## 2021-09-22 DIAGNOSIS — M2569 Stiffness of other specified joint, not elsewhere classified: Secondary | ICD-10-CM | POA: Diagnosis not present

## 2021-09-22 DIAGNOSIS — R262 Difficulty in walking, not elsewhere classified: Secondary | ICD-10-CM | POA: Diagnosis not present

## 2021-09-27 DIAGNOSIS — R262 Difficulty in walking, not elsewhere classified: Secondary | ICD-10-CM | POA: Diagnosis not present

## 2021-09-27 DIAGNOSIS — M7089 Other soft tissue disorders related to use, overuse and pressure multiple sites: Secondary | ICD-10-CM | POA: Diagnosis not present

## 2021-09-27 DIAGNOSIS — M2569 Stiffness of other specified joint, not elsewhere classified: Secondary | ICD-10-CM | POA: Diagnosis not present

## 2021-09-27 DIAGNOSIS — M2559 Pain in other specified joint: Secondary | ICD-10-CM | POA: Diagnosis not present

## 2021-10-19 DIAGNOSIS — M545 Low back pain, unspecified: Secondary | ICD-10-CM | POA: Diagnosis not present

## 2021-10-27 ENCOUNTER — Other Ambulatory Visit: Payer: Self-pay | Admitting: Orthopedic Surgery

## 2021-10-27 DIAGNOSIS — M79604 Pain in right leg: Secondary | ICD-10-CM

## 2021-11-11 ENCOUNTER — Ambulatory Visit
Admission: RE | Admit: 2021-11-11 | Discharge: 2021-11-11 | Disposition: A | Discharge status: Home / Self Care | Payer: Medicare Other | Source: Ambulatory Visit | Attending: Orthopedic Surgery | Admitting: Orthopedic Surgery

## 2021-11-11 DIAGNOSIS — M4727 Other spondylosis with radiculopathy, lumbosacral region: Secondary | ICD-10-CM | POA: Diagnosis not present

## 2021-11-11 DIAGNOSIS — M79604 Pain in right leg: Secondary | ICD-10-CM

## 2021-11-11 MED ORDER — METHYLPREDNISOLONE ACETATE 40 MG/ML INJ SUSP (RADIOLOG
80.0000 mg | Freq: Once | INTRAMUSCULAR | Status: AC
  Administered 2021-11-11: 80 mg via EPIDURAL

## 2021-11-11 MED ORDER — IOPAMIDOL (ISOVUE-M 200) INJECTION 41%
1.0000 mL | Freq: Once | INTRAMUSCULAR | Status: AC
  Administered 2021-11-11: 1 mL via EPIDURAL

## 2021-11-11 NOTE — Discharge Instructions (Signed)

## 2021-11-17 DIAGNOSIS — L99 Other disorders of skin and subcutaneous tissue in diseases classified elsewhere: Secondary | ICD-10-CM | POA: Diagnosis not present

## 2021-11-17 DIAGNOSIS — L570 Actinic keratosis: Secondary | ICD-10-CM | POA: Diagnosis not present

## 2021-11-22 ENCOUNTER — Other Ambulatory Visit: Payer: Self-pay | Admitting: Orthopedic Surgery

## 2021-11-22 DIAGNOSIS — M79604 Pain in right leg: Secondary | ICD-10-CM

## 2021-11-30 ENCOUNTER — Ambulatory Visit
Admission: RE | Admit: 2021-11-30 | Discharge: 2021-11-30 | Disposition: A | Discharge status: Home / Self Care | Payer: Medicare Other | Source: Ambulatory Visit | Attending: Orthopedic Surgery | Admitting: Orthopedic Surgery

## 2021-11-30 DIAGNOSIS — M79604 Pain in right leg: Secondary | ICD-10-CM

## 2021-11-30 DIAGNOSIS — Z23 Encounter for immunization: Secondary | ICD-10-CM | POA: Diagnosis not present

## 2021-11-30 DIAGNOSIS — M4727 Other spondylosis with radiculopathy, lumbosacral region: Secondary | ICD-10-CM | POA: Diagnosis not present

## 2021-11-30 MED ORDER — METHYLPREDNISOLONE ACETATE 40 MG/ML INJ SUSP (RADIOLOG
80.0000 mg | Freq: Once | INTRAMUSCULAR | Status: AC
  Administered 2021-11-30: 80 mg via EPIDURAL

## 2021-11-30 MED ORDER — IOPAMIDOL (ISOVUE-M 200) INJECTION 41%
1.0000 mL | Freq: Once | INTRAMUSCULAR | Status: AC
  Administered 2021-11-30: 1 mL via EPIDURAL

## 2021-11-30 NOTE — Discharge Instructions (Signed)

## 2022-01-27 DIAGNOSIS — N3941 Urge incontinence: Secondary | ICD-10-CM | POA: Diagnosis not present

## 2022-01-27 DIAGNOSIS — M5416 Radiculopathy, lumbar region: Secondary | ICD-10-CM | POA: Diagnosis not present

## 2022-01-27 DIAGNOSIS — Z23 Encounter for immunization: Secondary | ICD-10-CM | POA: Diagnosis not present

## 2022-01-27 DIAGNOSIS — F5101 Primary insomnia: Secondary | ICD-10-CM | POA: Diagnosis not present

## 2022-01-27 DIAGNOSIS — K219 Gastro-esophageal reflux disease without esophagitis: Secondary | ICD-10-CM | POA: Diagnosis not present

## 2022-03-29 DIAGNOSIS — Z419 Encounter for procedure for purposes other than remedying health state, unspecified: Secondary | ICD-10-CM | POA: Diagnosis not present

## 2022-03-29 DIAGNOSIS — L570 Actinic keratosis: Secondary | ICD-10-CM | POA: Diagnosis not present

## 2022-03-29 DIAGNOSIS — Z85828 Personal history of other malignant neoplasm of skin: Secondary | ICD-10-CM | POA: Diagnosis not present

## 2022-04-19 ENCOUNTER — Other Ambulatory Visit: Payer: Self-pay | Admitting: Orthopedic Surgery

## 2022-04-19 DIAGNOSIS — M79604 Pain in right leg: Secondary | ICD-10-CM

## 2022-04-24 ENCOUNTER — Other Ambulatory Visit: Payer: Self-pay | Admitting: Internal Medicine

## 2022-04-24 DIAGNOSIS — Z1231 Encounter for screening mammogram for malignant neoplasm of breast: Secondary | ICD-10-CM

## 2022-05-03 ENCOUNTER — Ambulatory Visit
Admission: RE | Admit: 2022-05-03 | Discharge: 2022-05-03 | Disposition: A | Discharge status: Home / Self Care | Payer: Medicare Other | Source: Ambulatory Visit | Attending: Orthopedic Surgery | Admitting: Orthopedic Surgery

## 2022-05-03 DIAGNOSIS — M79604 Pain in right leg: Secondary | ICD-10-CM

## 2022-05-03 DIAGNOSIS — M4727 Other spondylosis with radiculopathy, lumbosacral region: Secondary | ICD-10-CM | POA: Diagnosis not present

## 2022-05-03 MED ORDER — METHYLPREDNISOLONE ACETATE 40 MG/ML INJ SUSP (RADIOLOG
80.0000 mg | Freq: Once | INTRAMUSCULAR | Status: AC
Start: 2022-05-03 — End: 2022-05-03
  Administered 2022-05-03: 80 mg via EPIDURAL

## 2022-05-03 MED ORDER — IOPAMIDOL (ISOVUE-M 200) INJECTION 41%
1.0000 mL | Freq: Once | INTRAMUSCULAR | Status: AC
  Administered 2022-05-03: 1 mL via EPIDURAL

## 2022-05-03 NOTE — Discharge Instructions (Signed)

## 2022-06-07 ENCOUNTER — Ambulatory Visit
Admission: RE | Admit: 2022-06-07 | Discharge: 2022-06-07 | Disposition: A | Discharge status: Home / Self Care | Payer: Medicare Other | Source: Ambulatory Visit | Attending: Internal Medicine | Admitting: Internal Medicine

## 2022-06-07 DIAGNOSIS — Z1231 Encounter for screening mammogram for malignant neoplasm of breast: Secondary | ICD-10-CM

## 2022-06-27 DIAGNOSIS — M545 Low back pain, unspecified: Secondary | ICD-10-CM | POA: Diagnosis not present

## 2022-06-27 DIAGNOSIS — M25551 Pain in right hip: Secondary | ICD-10-CM | POA: Diagnosis not present

## 2022-06-30 DIAGNOSIS — H52223 Regular astigmatism, bilateral: Secondary | ICD-10-CM | POA: Diagnosis not present

## 2022-06-30 DIAGNOSIS — H2513 Age-related nuclear cataract, bilateral: Secondary | ICD-10-CM | POA: Diagnosis not present

## 2022-06-30 DIAGNOSIS — H53143 Visual discomfort, bilateral: Secondary | ICD-10-CM | POA: Diagnosis not present

## 2022-06-30 DIAGNOSIS — H5213 Myopia, bilateral: Secondary | ICD-10-CM | POA: Diagnosis not present

## 2022-06-30 DIAGNOSIS — H524 Presbyopia: Secondary | ICD-10-CM | POA: Diagnosis not present

## 2022-07-04 DIAGNOSIS — M545 Low back pain, unspecified: Secondary | ICD-10-CM | POA: Diagnosis not present

## 2022-07-17 DIAGNOSIS — M545 Low back pain, unspecified: Secondary | ICD-10-CM | POA: Diagnosis not present

## 2022-07-18 DIAGNOSIS — Z79899 Other long term (current) drug therapy: Secondary | ICD-10-CM | POA: Diagnosis not present

## 2022-07-18 DIAGNOSIS — K219 Gastro-esophageal reflux disease without esophagitis: Secondary | ICD-10-CM | POA: Diagnosis not present

## 2022-07-18 DIAGNOSIS — M5416 Radiculopathy, lumbar region: Secondary | ICD-10-CM | POA: Diagnosis not present

## 2022-07-26 DIAGNOSIS — Z0000 Encounter for general adult medical examination without abnormal findings: Secondary | ICD-10-CM | POA: Diagnosis not present

## 2022-07-26 DIAGNOSIS — N3941 Urge incontinence: Secondary | ICD-10-CM | POA: Diagnosis not present

## 2022-07-26 DIAGNOSIS — E785 Hyperlipidemia, unspecified: Secondary | ICD-10-CM | POA: Diagnosis not present

## 2022-07-26 DIAGNOSIS — M5416 Radiculopathy, lumbar region: Secondary | ICD-10-CM | POA: Diagnosis not present

## 2022-07-26 DIAGNOSIS — K219 Gastro-esophageal reflux disease without esophagitis: Secondary | ICD-10-CM | POA: Diagnosis not present

## 2022-07-26 DIAGNOSIS — Z1331 Encounter for screening for depression: Secondary | ICD-10-CM | POA: Diagnosis not present

## 2022-07-26 DIAGNOSIS — Z1339 Encounter for screening examination for other mental health and behavioral disorders: Secondary | ICD-10-CM | POA: Diagnosis not present

## 2022-07-26 DIAGNOSIS — F5101 Primary insomnia: Secondary | ICD-10-CM | POA: Diagnosis not present

## 2022-07-27 DIAGNOSIS — M5416 Radiculopathy, lumbar region: Secondary | ICD-10-CM | POA: Diagnosis not present

## 2022-08-04 DIAGNOSIS — L570 Actinic keratosis: Secondary | ICD-10-CM | POA: Diagnosis not present

## 2022-08-04 DIAGNOSIS — L99 Other disorders of skin and subcutaneous tissue in diseases classified elsewhere: Secondary | ICD-10-CM | POA: Diagnosis not present

## 2022-08-09 DIAGNOSIS — R82998 Other abnormal findings in urine: Secondary | ICD-10-CM | POA: Diagnosis not present

## 2022-08-10 DIAGNOSIS — M5416 Radiculopathy, lumbar region: Secondary | ICD-10-CM | POA: Diagnosis not present

## 2022-08-14 DIAGNOSIS — M5416 Radiculopathy, lumbar region: Secondary | ICD-10-CM | POA: Diagnosis not present

## 2022-08-29 DIAGNOSIS — M5416 Radiculopathy, lumbar region: Secondary | ICD-10-CM | POA: Diagnosis not present

## 2022-08-30 ENCOUNTER — Other Ambulatory Visit: Payer: Self-pay | Admitting: Internal Medicine

## 2022-08-30 DIAGNOSIS — E785 Hyperlipidemia, unspecified: Secondary | ICD-10-CM

## 2022-09-19 DIAGNOSIS — M5416 Radiculopathy, lumbar region: Secondary | ICD-10-CM | POA: Diagnosis not present

## 2022-09-19 DIAGNOSIS — M545 Low back pain, unspecified: Secondary | ICD-10-CM | POA: Diagnosis not present

## 2022-09-19 DIAGNOSIS — M25551 Pain in right hip: Secondary | ICD-10-CM | POA: Diagnosis not present

## 2022-09-19 DIAGNOSIS — R269 Unspecified abnormalities of gait and mobility: Secondary | ICD-10-CM | POA: Diagnosis not present

## 2022-10-02 ENCOUNTER — Ambulatory Visit
Admission: RE | Admit: 2022-10-02 | Discharge: 2022-10-02 | Disposition: A | Discharge status: Home / Self Care | Payer: No Typology Code available for payment source | Source: Ambulatory Visit | Attending: Internal Medicine | Admitting: Internal Medicine

## 2022-10-02 DIAGNOSIS — M25551 Pain in right hip: Secondary | ICD-10-CM | POA: Diagnosis not present

## 2022-10-02 DIAGNOSIS — M5416 Radiculopathy, lumbar region: Secondary | ICD-10-CM | POA: Diagnosis not present

## 2022-10-02 DIAGNOSIS — E785 Hyperlipidemia, unspecified: Secondary | ICD-10-CM

## 2022-10-02 DIAGNOSIS — R269 Unspecified abnormalities of gait and mobility: Secondary | ICD-10-CM | POA: Diagnosis not present

## 2022-10-02 DIAGNOSIS — M545 Low back pain, unspecified: Secondary | ICD-10-CM | POA: Diagnosis not present

## 2022-11-03 DIAGNOSIS — S90812A Abrasion, left foot, initial encounter: Secondary | ICD-10-CM | POA: Diagnosis not present

## 2022-11-16 DIAGNOSIS — M5416 Radiculopathy, lumbar region: Secondary | ICD-10-CM | POA: Diagnosis not present

## 2022-11-17 DIAGNOSIS — M25561 Pain in right knee: Secondary | ICD-10-CM | POA: Diagnosis not present

## 2022-11-23 DIAGNOSIS — R8290 Unspecified abnormal findings in urine: Secondary | ICD-10-CM | POA: Diagnosis not present

## 2022-11-23 DIAGNOSIS — R351 Nocturia: Secondary | ICD-10-CM | POA: Diagnosis not present

## 2022-12-07 DIAGNOSIS — M25551 Pain in right hip: Secondary | ICD-10-CM | POA: Diagnosis not present

## 2022-12-07 DIAGNOSIS — M545 Low back pain, unspecified: Secondary | ICD-10-CM | POA: Diagnosis not present

## 2022-12-07 DIAGNOSIS — M5416 Radiculopathy, lumbar region: Secondary | ICD-10-CM | POA: Diagnosis not present

## 2022-12-07 DIAGNOSIS — R269 Unspecified abnormalities of gait and mobility: Secondary | ICD-10-CM | POA: Diagnosis not present

## 2022-12-19 DIAGNOSIS — R269 Unspecified abnormalities of gait and mobility: Secondary | ICD-10-CM | POA: Diagnosis not present

## 2022-12-19 DIAGNOSIS — M25551 Pain in right hip: Secondary | ICD-10-CM | POA: Diagnosis not present

## 2022-12-19 DIAGNOSIS — M5416 Radiculopathy, lumbar region: Secondary | ICD-10-CM | POA: Diagnosis not present

## 2022-12-19 DIAGNOSIS — M545 Low back pain, unspecified: Secondary | ICD-10-CM | POA: Diagnosis not present

## 2022-12-26 DIAGNOSIS — M5416 Radiculopathy, lumbar region: Secondary | ICD-10-CM | POA: Diagnosis not present

## 2022-12-26 DIAGNOSIS — M545 Low back pain, unspecified: Secondary | ICD-10-CM | POA: Diagnosis not present

## 2022-12-26 DIAGNOSIS — M25551 Pain in right hip: Secondary | ICD-10-CM | POA: Diagnosis not present

## 2022-12-26 DIAGNOSIS — R269 Unspecified abnormalities of gait and mobility: Secondary | ICD-10-CM | POA: Diagnosis not present

## 2023-01-09 DIAGNOSIS — M25551 Pain in right hip: Secondary | ICD-10-CM | POA: Diagnosis not present

## 2023-01-09 DIAGNOSIS — M5416 Radiculopathy, lumbar region: Secondary | ICD-10-CM | POA: Diagnosis not present

## 2023-01-09 DIAGNOSIS — M545 Low back pain, unspecified: Secondary | ICD-10-CM | POA: Diagnosis not present

## 2023-01-09 DIAGNOSIS — R269 Unspecified abnormalities of gait and mobility: Secondary | ICD-10-CM | POA: Diagnosis not present

## 2023-01-18 DIAGNOSIS — M545 Low back pain, unspecified: Secondary | ICD-10-CM | POA: Diagnosis not present

## 2023-01-18 DIAGNOSIS — R269 Unspecified abnormalities of gait and mobility: Secondary | ICD-10-CM | POA: Diagnosis not present

## 2023-01-18 DIAGNOSIS — M5416 Radiculopathy, lumbar region: Secondary | ICD-10-CM | POA: Diagnosis not present

## 2023-01-18 DIAGNOSIS — M25551 Pain in right hip: Secondary | ICD-10-CM | POA: Diagnosis not present

## 2023-02-13 DIAGNOSIS — R269 Unspecified abnormalities of gait and mobility: Secondary | ICD-10-CM | POA: Diagnosis not present

## 2023-02-13 DIAGNOSIS — M25551 Pain in right hip: Secondary | ICD-10-CM | POA: Diagnosis not present

## 2023-02-13 DIAGNOSIS — M5416 Radiculopathy, lumbar region: Secondary | ICD-10-CM | POA: Diagnosis not present

## 2023-02-13 DIAGNOSIS — M545 Low back pain, unspecified: Secondary | ICD-10-CM | POA: Diagnosis not present

## 2023-02-19 DIAGNOSIS — M5416 Radiculopathy, lumbar region: Secondary | ICD-10-CM | POA: Diagnosis not present

## 2023-02-22 DIAGNOSIS — R269 Unspecified abnormalities of gait and mobility: Secondary | ICD-10-CM | POA: Diagnosis not present

## 2023-02-22 DIAGNOSIS — M25551 Pain in right hip: Secondary | ICD-10-CM | POA: Diagnosis not present

## 2023-02-22 DIAGNOSIS — M545 Low back pain, unspecified: Secondary | ICD-10-CM | POA: Diagnosis not present

## 2023-02-22 DIAGNOSIS — M5416 Radiculopathy, lumbar region: Secondary | ICD-10-CM | POA: Diagnosis not present

## 2023-03-20 DIAGNOSIS — M5416 Radiculopathy, lumbar region: Secondary | ICD-10-CM | POA: Diagnosis not present

## 2023-03-21 DIAGNOSIS — M25561 Pain in right knee: Secondary | ICD-10-CM | POA: Diagnosis not present

## 2023-05-07 ENCOUNTER — Other Ambulatory Visit: Payer: Self-pay | Admitting: Internal Medicine

## 2023-05-07 DIAGNOSIS — Z1231 Encounter for screening mammogram for malignant neoplasm of breast: Secondary | ICD-10-CM

## 2023-06-12 ENCOUNTER — Ambulatory Visit: Payer: Medicare Other

## 2023-06-20 DIAGNOSIS — M25561 Pain in right knee: Secondary | ICD-10-CM | POA: Diagnosis not present

## 2023-07-09 DIAGNOSIS — H5213 Myopia, bilateral: Secondary | ICD-10-CM | POA: Diagnosis not present

## 2023-07-09 DIAGNOSIS — H2513 Age-related nuclear cataract, bilateral: Secondary | ICD-10-CM | POA: Diagnosis not present

## 2023-07-09 DIAGNOSIS — H52203 Unspecified astigmatism, bilateral: Secondary | ICD-10-CM | POA: Diagnosis not present

## 2023-07-25 DIAGNOSIS — D0472 Carcinoma in situ of skin of left lower limb, including hip: Secondary | ICD-10-CM | POA: Diagnosis not present

## 2023-07-25 DIAGNOSIS — L99 Other disorders of skin and subcutaneous tissue in diseases classified elsewhere: Secondary | ICD-10-CM | POA: Diagnosis not present

## 2023-07-25 DIAGNOSIS — D485 Neoplasm of uncertain behavior of skin: Secondary | ICD-10-CM | POA: Diagnosis not present

## 2023-07-31 DIAGNOSIS — Z79899 Other long term (current) drug therapy: Secondary | ICD-10-CM | POA: Diagnosis not present

## 2023-07-31 DIAGNOSIS — K219 Gastro-esophageal reflux disease without esophagitis: Secondary | ICD-10-CM | POA: Diagnosis not present

## 2023-07-31 DIAGNOSIS — E785 Hyperlipidemia, unspecified: Secondary | ICD-10-CM | POA: Diagnosis not present

## 2023-08-01 DIAGNOSIS — K219 Gastro-esophageal reflux disease without esophagitis: Secondary | ICD-10-CM | POA: Diagnosis not present

## 2023-08-01 DIAGNOSIS — Z1331 Encounter for screening for depression: Secondary | ICD-10-CM | POA: Diagnosis not present

## 2023-08-01 DIAGNOSIS — M5416 Radiculopathy, lumbar region: Secondary | ICD-10-CM | POA: Diagnosis not present

## 2023-08-01 DIAGNOSIS — Z1339 Encounter for screening examination for other mental health and behavioral disorders: Secondary | ICD-10-CM | POA: Diagnosis not present

## 2023-08-01 DIAGNOSIS — Z0000 Encounter for general adult medical examination without abnormal findings: Secondary | ICD-10-CM | POA: Diagnosis not present

## 2023-08-01 DIAGNOSIS — F5101 Primary insomnia: Secondary | ICD-10-CM | POA: Diagnosis not present

## 2023-08-01 DIAGNOSIS — E785 Hyperlipidemia, unspecified: Secondary | ICD-10-CM | POA: Diagnosis not present

## 2023-08-01 DIAGNOSIS — N3941 Urge incontinence: Secondary | ICD-10-CM | POA: Diagnosis not present

## 2023-08-01 DIAGNOSIS — R82998 Other abnormal findings in urine: Secondary | ICD-10-CM | POA: Diagnosis not present

## 2023-08-01 DIAGNOSIS — Z23 Encounter for immunization: Secondary | ICD-10-CM | POA: Diagnosis not present

## 2023-09-20 DIAGNOSIS — C44729 Squamous cell carcinoma of skin of left lower limb, including hip: Secondary | ICD-10-CM | POA: Diagnosis not present

## 2023-09-20 DIAGNOSIS — L570 Actinic keratosis: Secondary | ICD-10-CM | POA: Diagnosis not present

## 2023-10-31 DIAGNOSIS — M25561 Pain in right knee: Secondary | ICD-10-CM | POA: Diagnosis not present

## 2023-11-07 DIAGNOSIS — C44729 Squamous cell carcinoma of skin of left lower limb, including hip: Secondary | ICD-10-CM | POA: Diagnosis not present

## 2023-11-07 DIAGNOSIS — L99 Other disorders of skin and subcutaneous tissue in diseases classified elsewhere: Secondary | ICD-10-CM | POA: Diagnosis not present

## 2023-11-07 DIAGNOSIS — D229 Melanocytic nevi, unspecified: Secondary | ICD-10-CM | POA: Diagnosis not present

## 2023-11-07 DIAGNOSIS — L570 Actinic keratosis: Secondary | ICD-10-CM | POA: Diagnosis not present

## 2023-11-15 ENCOUNTER — Inpatient Hospital Stay
Admission: RE | Admit: 2023-11-15 | Discharge: 2023-11-15 | Discharge status: Home / Self Care | Source: Ambulatory Visit | Attending: Internal Medicine | Admitting: Internal Medicine

## 2023-11-15 DIAGNOSIS — Z1231 Encounter for screening mammogram for malignant neoplasm of breast: Secondary | ICD-10-CM | POA: Diagnosis not present

## 2023-12-04 DIAGNOSIS — M19071 Primary osteoarthritis, right ankle and foot: Secondary | ICD-10-CM | POA: Diagnosis not present

## 2023-12-11 DIAGNOSIS — M5416 Radiculopathy, lumbar region: Secondary | ICD-10-CM | POA: Diagnosis not present

## 2024-02-11 DIAGNOSIS — M25561 Pain in right knee: Secondary | ICD-10-CM | POA: Diagnosis not present

## 2024-02-22 DIAGNOSIS — M5416 Radiculopathy, lumbar region: Secondary | ICD-10-CM | POA: Diagnosis not present

## 2024-03-02 ENCOUNTER — Other Ambulatory Visit: Payer: Self-pay

## 2024-03-02 ENCOUNTER — Emergency Department (HOSPITAL_BASED_OUTPATIENT_CLINIC_OR_DEPARTMENT_OTHER)
Admission: EM | Admit: 2024-03-02 | Discharge: 2024-03-02 | Disposition: A | Discharge status: Home / Self Care | Attending: Emergency Medicine | Admitting: Emergency Medicine

## 2024-03-02 ENCOUNTER — Encounter (HOSPITAL_BASED_OUTPATIENT_CLINIC_OR_DEPARTMENT_OTHER): Payer: Self-pay | Admitting: *Deleted

## 2024-03-02 DIAGNOSIS — W548XXA Other contact with dog, initial encounter: Secondary | ICD-10-CM | POA: Insufficient documentation

## 2024-03-02 DIAGNOSIS — S81812A Laceration without foreign body, left lower leg, initial encounter: Secondary | ICD-10-CM | POA: Diagnosis present

## 2024-03-02 MED ORDER — LIDOCAINE-EPINEPHRINE (PF) 2 %-1:200000 IJ SOLN
10.0000 mL | Freq: Once | INTRAMUSCULAR | Status: AC
  Administered 2024-03-02: 10 mL via INTRADERMAL
  Filled 2024-03-02: qty 20

## 2024-03-02 NOTE — ED Triage Notes (Signed)
 Pt to ED with laceration to right lower leg from dogs claws.

## 2024-03-02 NOTE — ED Notes (Signed)
 Lido & suture cart at bedside for EDP

## 2024-03-02 NOTE — Discharge Instructions (Signed)
 Take Tylenol and/or ibuprofen for any discomfort. Sutures will need to be removed in 10 days. Have the wound rechecked if there are any signs of infection - drainage, increasing pain or redness around the wound or into the leg, fever.

## 2024-03-02 NOTE — ED Provider Notes (Signed)
 " [place] EMERGENCY DEPARTMENT AT [facility] Provider Note   CSN: [id number] Arrival date & time: 03/02/24  1709     Patient presents with: Laceration   Nichole Simpson is a 73 y.o. female.   Patient to ED with laceration to left lower leg caused by her dog after he slid off her lap and caught the leg with his nails. No biting. No other injury.   The history is provided by the patient. No language interpreter was used.  Laceration      Prior to Admission medications  Medication Sig Start Date End Date Taking? Authorizing Provider  ibuprofen (ADVIL) 400 MG tablet Take 400 mg by mouth every 6 (six) hours as needed.    [provider]  mirabegron ER (MYRBETRIQ) 25 MG TB24 tablet Myrbetriq 25 mg tablet,extended release 02/02/20   [provider]  OVER THE COUNTER MEDICATION Take 2 capsules by mouth in the morning and at bedtime.    [provider]  TURMERIC CURCUMIN PO Take 2 capsules by mouth daily.    [provider]  zolpidem (AMBIEN) 10 MG tablet zolpidem 10 mg tablet  TAKE 1 TABLET BY MOUTH AT BEDTIME AS NEEDED 07/21/21   [provider]    Allergies: Patient has no known allergies.    Review of Systems  Updated Vital Signs BP (!) 150/91 (BP Location: Right Arm)   Pulse 72   Temp 98.9 F (37.2 C) (Oral)   Resp 18   LMP  (LMP Unknown)   SpO2 98%   Physical Exam Vitals and nursing note reviewed.  Constitutional:      General: She is not in acute distress.    Appearance: She is well-developed. She is not ill-appearing.  Pulmonary:     Effort: Pulmonary effort is normal.  Musculoskeletal:        General: Normal range of motion.     Cervical back: Normal range of motion.  Skin:    General: Skin is warm and dry.     Comments: 4 cm laceration to left lower leg medially. No active bleeding.   Neurological:     Mental Status: She is alert and oriented to person, place, and time.     (all labs ordered  are listed, but only abnormal results are displayed) Labs Reviewed - No data to display  EKG: None  Radiology: No results found.   .Laceration Repair  Date/Time: 03/02/2024 10:44 PM  Performed by: Odell Balls, PA-C Authorized by: Odell Balls, PA-C   Consent:    Consent obtained:  Verbal   Consent given by:  Patient Universal protocol:    Procedure explained and questions answered to patient or proxy's satisfaction: yes     Patient identity confirmed:  Verbally with patient Anesthesia:    Anesthesia method:  Local infiltration   Local anesthetic:  Lidocaine  2% WITH epi Laceration details:    Location:  Leg   Leg location:  L lower leg   Length (cm):  4 Pre-procedure details:    Preparation:  Patient was prepped and draped in usual sterile fashion Skin repair:    Repair method:  Sutures   Suture size:  5-0 and 6-0   Suture material:  Prolene   Suture technique:  Simple interrupted   Number of sutures:  7 Approximation:    Laceration repair approximation: Skin tear over a portion of the laceration. Repair type:    Repair type:  Simple Post-procedure details:    Dressing:  Antibiotic  ointment and non-adherent dressing   Procedure completion:  Tolerated well, no immediate complications    Medications Ordered in the ED  lidocaine -EPINEPHrine  (XYLOCAINE  W/EPI) 2 %-1:200000 (PF) injection 10 mL (10 mLs Intradermal Given by Other 03/02/24 1912)    Clinical Course as of 03/02/24 2248  Sun Mar 02, 2024  2247 Patient presents with simple laceration to left lower leg repaired as per procedure note.  [SU]    Clinical Course User Index [SU] Odell Balls, PA-C                                 Medical Decision Making Risk Prescription drug management.        Final diagnoses:  Laceration of left lower leg, initial encounter    ED Discharge Orders     None          Odell Balls, PA-C 03/02/24 2248    Dreama Longs, MD 03/07/24 [phone]  "
